# Patient Record
Sex: Female | Born: 1940 | Race: White | Hispanic: No | Marital: Married | Smoking: Former smoker
Health system: Southern US, Community
[De-identification: ages and names within clinical notes are randomized; demographics above are authoritative.]

## PROBLEM LIST (undated history)

## (undated) DIAGNOSIS — Z8589 Personal history of malignant neoplasm of other organs and systems: Secondary | ICD-10-CM

## (undated) DIAGNOSIS — I10 Essential (primary) hypertension: Secondary | ICD-10-CM

## (undated) DIAGNOSIS — E669 Obesity, unspecified: Secondary | ICD-10-CM

## (undated) DIAGNOSIS — K589 Irritable bowel syndrome without diarrhea: Secondary | ICD-10-CM

## (undated) DIAGNOSIS — M810 Age-related osteoporosis without current pathological fracture: Secondary | ICD-10-CM

## (undated) DIAGNOSIS — E785 Hyperlipidemia, unspecified: Secondary | ICD-10-CM

## (undated) HISTORY — DX: Obesity, unspecified: E66.9

## (undated) HISTORY — PX: CHOLECYSTECTOMY: SHX55

## (undated) HISTORY — DX: Irritable bowel syndrome, unspecified: K58.9

## (undated) HISTORY — PX: TONSILLECTOMY: SUR1361

## (undated) HISTORY — DX: Age-related osteoporosis without current pathological fracture: M81.0

## (undated) HISTORY — PX: APPENDECTOMY: SHX54

## (undated) HISTORY — PX: THYROIDECTOMY, PARTIAL: SHX18

## (undated) HISTORY — PX: PARTIAL HYSTERECTOMY: SHX80

## (undated) HISTORY — PX: ANKLE FRACTURE SURGERY: SHX122

## (undated) HISTORY — DX: Personal history of malignant neoplasm of other organs and systems: Z85.89

---

## 2000-08-19 ENCOUNTER — Observation Stay (HOSPITAL_COMMUNITY): Admission: RE | Admit: 2000-08-19 | Discharge: 2000-08-20 | Payer: Self-pay | Admitting: Orthopedic Surgery

## 2000-08-19 ENCOUNTER — Encounter: Payer: Self-pay | Admitting: Orthopedic Surgery

## 2003-10-22 ENCOUNTER — Ambulatory Visit (HOSPITAL_COMMUNITY): Admission: RE | Admit: 2003-10-22 | Discharge: 2003-10-22 | Payer: Self-pay | Admitting: Gastroenterology

## 2010-08-17 DIAGNOSIS — M810 Age-related osteoporosis without current pathological fracture: Secondary | ICD-10-CM

## 2010-08-17 HISTORY — DX: Age-related osteoporosis without current pathological fracture: M81.0

## 2012-10-07 ENCOUNTER — Encounter (HOSPITAL_COMMUNITY): Payer: Self-pay | Admitting: Emergency Medicine

## 2012-10-07 ENCOUNTER — Emergency Department (HOSPITAL_COMMUNITY)
Admission: EM | Admit: 2012-10-07 | Discharge: 2012-10-07 | Disposition: A | Discharge status: Home / Self Care | Payer: Medicare Other | Attending: Emergency Medicine | Admitting: Emergency Medicine

## 2012-10-07 ENCOUNTER — Emergency Department (HOSPITAL_COMMUNITY): Payer: Medicare Other

## 2012-10-07 DIAGNOSIS — E785 Hyperlipidemia, unspecified: Secondary | ICD-10-CM | POA: Insufficient documentation

## 2012-10-07 DIAGNOSIS — Z79899 Other long term (current) drug therapy: Secondary | ICD-10-CM | POA: Insufficient documentation

## 2012-10-07 DIAGNOSIS — I10 Essential (primary) hypertension: Secondary | ICD-10-CM | POA: Insufficient documentation

## 2012-10-07 DIAGNOSIS — S0003XA Contusion of scalp, initial encounter: Secondary | ICD-10-CM | POA: Insufficient documentation

## 2012-10-07 DIAGNOSIS — S060XAA Concussion with loss of consciousness status unknown, initial encounter: Secondary | ICD-10-CM | POA: Insufficient documentation

## 2012-10-07 DIAGNOSIS — Y939 Activity, unspecified: Secondary | ICD-10-CM | POA: Insufficient documentation

## 2012-10-07 DIAGNOSIS — Y9289 Other specified places as the place of occurrence of the external cause: Secondary | ICD-10-CM | POA: Insufficient documentation

## 2012-10-07 DIAGNOSIS — Z9181 History of falling: Secondary | ICD-10-CM | POA: Insufficient documentation

## 2012-10-07 DIAGNOSIS — W19XXXA Unspecified fall, initial encounter: Secondary | ICD-10-CM | POA: Insufficient documentation

## 2012-10-07 HISTORY — DX: Hyperlipidemia, unspecified: E78.5

## 2012-10-07 HISTORY — DX: Essential (primary) hypertension: I10

## 2012-10-07 LAB — POCT I-STAT, CHEM 8
Chloride: 107 mEq/L (ref 96–112)
Glucose, Bld: 124 mg/dL — ABNORMAL HIGH (ref 70–99)
HCT: 42 % (ref 36.0–46.0)
Potassium: 3.8 mEq/L (ref 3.5–5.1)
Sodium: 143 mEq/L (ref 135–145)

## 2012-10-07 LAB — CBC WITH DIFFERENTIAL/PLATELET
Basophils Relative: 0 % (ref 0–1)
HCT: 41.8 % (ref 36.0–46.0)
Hemoglobin: 14.2 g/dL (ref 12.0–15.0)
Lymphs Abs: 1.4 10*3/uL (ref 0.7–4.0)
MCH: 28.7 pg (ref 26.0–34.0)
MCHC: 34 g/dL (ref 30.0–36.0)
Monocytes Absolute: 0.9 10*3/uL (ref 0.1–1.0)
Monocytes Relative: 8 % (ref 3–12)
Neutro Abs: 9 10*3/uL — ABNORMAL HIGH (ref 1.7–7.7)

## 2012-10-07 LAB — URINALYSIS, ROUTINE W REFLEX MICROSCOPIC
Bilirubin Urine: NEGATIVE
Glucose, UA: NEGATIVE mg/dL
Hgb urine dipstick: NEGATIVE
Specific Gravity, Urine: 1.008 (ref 1.005–1.030)
Urobilinogen, UA: 0.2 mg/dL (ref 0.0–1.0)

## 2012-10-07 LAB — PROTIME-INR: INR: 0.88 (ref 0.00–1.49)

## 2012-10-07 LAB — APTT: aPTT: 32 seconds (ref 24–37)
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
## 2012-10-07 NOTE — ED Notes (Signed)
Pt returned from CT and placed back on monitor; pt alert and mentating appropriately; Pt knows where she is and why she is here; pt is unsure of how she fell; husband states this is pts third fall in a little over a year; pt denies numbness/tingling; pt denies blurred vision; pt denies dizziness and lightheadedness.
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
## 2012-10-07 NOTE — ED Notes (Signed)
Pt given d/c teaching and follow up care instructions; pts husband has been educated on signs and symptoms to look out for and need to return to ED; pt has been educated on at home pain management; pt and husband verbalize understanding of d/c instructions and have no further questions upon d/c. Pt taken to waiting room in wheelchair to wait for husband.

## 2014-08-17 LAB — BASIC METABOLIC PANEL: BUN: 14 mg/dL (ref 4–21)

## 2015-02-28 LAB — CBC AND DIFFERENTIAL
HEMATOCRIT: 44 % (ref 36–46)
Hemoglobin: 14.5 g/dL (ref 12.0–16.0)
Platelets: 153 10*3/uL (ref 150–399)
WBC: 5.8 10^3/mL

## 2015-02-28 LAB — LIPID PANEL
Cholesterol: 150 mg/dL (ref 0–200)
HDL: 44 mg/dL (ref 35–70)
LDL Cholesterol: 80 mg/dL
LDL/HDL RATIO: 3.4
TRIGLYCERIDES: 129 mg/dL (ref 40–160)

## 2015-02-28 LAB — BASIC METABOLIC PANEL
GLUCOSE: 106 mg/dL
POTASSIUM: 4.7 mmol/L (ref 3.4–5.3)
Sodium: 138 mmol/L (ref 137–147)

## 2015-05-08 ENCOUNTER — Encounter: Payer: Self-pay | Admitting: Internal Medicine

## 2015-05-09 ENCOUNTER — Ambulatory Visit (INDEPENDENT_AMBULATORY_CARE_PROVIDER_SITE_OTHER): Payer: Medicare Other | Admitting: Internal Medicine

## 2015-05-09 ENCOUNTER — Encounter: Payer: Self-pay | Admitting: Internal Medicine

## 2015-05-09 DIAGNOSIS — I10 Essential (primary) hypertension: Secondary | ICD-10-CM | POA: Diagnosis not present

## 2015-05-09 DIAGNOSIS — E559 Vitamin D deficiency, unspecified: Secondary | ICD-10-CM

## 2015-05-09 DIAGNOSIS — E213 Hyperparathyroidism, unspecified: Secondary | ICD-10-CM | POA: Diagnosis not present

## 2015-05-09 MED ORDER — LOSARTAN POTASSIUM 100 MG PO TABS: 100.0000 mg | ORAL_TABLET | Freq: Every day | ORAL | Status: DC

## 2015-05-09 MED ORDER — FUROSEMIDE 20 MG PO TABS: 20.0000 mg | ORAL_TABLET | Freq: Every day | ORAL | Status: DC
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
## 2015-05-09 NOTE — Patient Instructions (Signed)
Please increase Vitamin D to 2000 units daily.  Maintain a daily calcium intake of ~1000 mg - from diet.  Stop Hyzaar and start: - Losartan 100 mg daily - Lasix 20 mg daily  Please call me if Blood Pressure >130/90.  Please schedule a lab appointment in 1 month.   Please come back for a follow-up appointment in 6 months.  Hypercalcemia Hypercalcemia means the calcium in your blood is too high. Calcium in our blood is important for the control of many things, such as:  Blood clotting.  Conducting of nerve impulses.  Muscle contraction.  Maintaining teeth and bone health.  Other body functions. In the bloodstream, calcium maintains a constant balance with another mineral, phosphate. Calcium is absorbed into the body through the small intestine. This is helped by vitamin D. Calcium levels are maintained mostly by vitamin D and a hormone (parathyroid hormone). But the kidneys also help. Hypercalcemia can happen when the concentration of calcium is too high for the kidneys to maintain balance. The body maintains a balance between the calcium we eat and the calcium already in our body. If calcium intake is increased or we cannot use calcium properly, there may be problems. Some common sources of calcium are:   Dairy products.  Nuts.  Eggs.  Whole grains.  Legumes.  Green leafy vegetables. CAUSES There are many causes of this condition, but some common ones are:  Hyperparathyroidism. This is an overactivity of the parathyroid gland.  Cancers of the breast, kidney, lung, head, and neck are common causes of calcium increases.  Medications that cause you to urinate more often (diuretics), nausea, vomiting, and diarrhea also increase the calcium in the blood.  Overuse of calcium-containing antacids. SYMPTOMS  Many patients with mild hypercalcemia have no symptoms. For those with symptoms, common problems include:  Loss of appetite.  Constipation.  Increased  thirst.  Heart rhythm changes.  Abnormal thinking.  Nausea.  Abdominal pain.  Kidney stones.  Mood swings.  Coma and death when severe.  Vomiting.  Increased urination.  High blood pressure.  Confusion. DIAGNOSIS   Your caregiver will do a medical history and perform a physical exam on you.  Calcium and parathyroid hormone (PTH) may be measured with a blood test. TREATMENT   The treatment depends on the calcium level and what is causing the higher level. Hypercalcemia can be life threatening. Fast lowering of the calcium level may be necessary.  With normal kidney function, fluids can be given by vein to clear the excess calcium. Hemodialysis works well to reduce dangerous calcium levels if there is poor kidney function. This is a procedure in which a machine is used to filter out unwanted substances. The blood is then returned to the body.  Drugs, such as diuretics, can be given after adequate fluid intake is established. These medications help the kidneys get rid of extra calcium. Drugs that lessen (inhibit) bone loss are helpful in gaining long-term control. Phosphate pills help lower high calcium levels caused by a low supply of phosphate. Anti-inflammatory agents such as steroids are helpful with some cancers and toxic levels of vitamin D.  Treatment of the underlying cause of the hypercalcemia will also correct the imbalance. Hyperparathyroidism is usually treated by surgical removal of one or more of the parathyroid glands and any tissue, other than the glands themselves, that is producing too much hormone.  The hypercalcemia caused by cancer is difficult to treat without controlling the cancer. Symptoms can be improved with fluids and  drug therapy as outlined above. PROGNOSIS   Surgery to remove the parathyroid glands is usually successful. This also depends on the amount of damage to the kidneys and whether or not it can be treated.  Mild hypercalcemia can be  controlled with good fluid intake and the use of effective medications.  Hypercalcemia often develops as a late complication of cancer. The expected outlook is poor without effective anticancer therapy. PREVENTION   If you are at risk for developing hypercalcemia, be familiar with early symptoms. Report these to your caregiver.  Good fluid intake (up to four quarts of liquid a day if possible) is helpful.  Try to control nausea and vomiting, and treat fevers to avoid dehydration.  Lowering the amount of calcium in your diet is not necessary. High blood calcium reduces absorption of calcium in the intestine.  Stay as active as possible. SEEK IMMEDIATE MEDICAL CARE IF:   You develop chest pain, sweating, or shortness of breath.  You get confused, feel faint or pass out.  You develop severe nausea and vomiting. MAKE SURE YOU:   Understand these instructions.  Will watch your condition.  Will get help right away if you are not doing well or get worse. Document Released: 10/17/2004 Document Revised: 12/18/2013 Document Reviewed: 07/29/2010 [facility] Patient Information 2015 [place], [place]. This information is not intended to replace advice given to you by your health care Josie Burleigh. Make sure you discuss any questions you have with your health care Augusta Hilbert.

## 2015-06-07 ENCOUNTER — Other Ambulatory Visit (INDEPENDENT_AMBULATORY_CARE_PROVIDER_SITE_OTHER): Payer: Medicare Other

## 2015-06-07 ENCOUNTER — Other Ambulatory Visit: Payer: Self-pay | Admitting: *Deleted

## 2015-06-07 ENCOUNTER — Other Ambulatory Visit: Payer: Self-pay | Admitting: Internal Medicine

## 2015-06-07 DIAGNOSIS — E213 Hyperparathyroidism, unspecified: Secondary | ICD-10-CM

## 2015-06-07 DIAGNOSIS — E559 Vitamin D deficiency, unspecified: Secondary | ICD-10-CM

## 2015-06-07 LAB — VITAMIN D 25 HYDROXY (VIT D DEFICIENCY, FRACTURES): VITD: 46.13 ng/mL (ref 30.00–100.00)

## 2015-06-17 ENCOUNTER — Other Ambulatory Visit: Payer: Medicare Other

## 2015-06-18 LAB — CALCIUM, URINE, 24 HOUR
Calcium, 24 hour urine: 85 mg/24 h (ref 35–250)
Calcium, Ur: 5 mg/dL

## 2015-06-18 LAB — CREATININE, URINE, 24 HOUR
CREATININE 24H UR: 0.51 g/(24.h) — AB (ref 0.63–2.50)
CREATININE, URINE: 30 mg/dL (ref 20–320)

## 2015-06-19 ENCOUNTER — Other Ambulatory Visit: Payer: Medicare Other

## 2015-06-20 ENCOUNTER — Other Ambulatory Visit (INDEPENDENT_AMBULATORY_CARE_PROVIDER_SITE_OTHER): Payer: Medicare Other

## 2015-06-20 DIAGNOSIS — E213 Hyperparathyroidism, unspecified: Secondary | ICD-10-CM | POA: Diagnosis not present

## 2015-06-20 LAB — PHOSPHORUS: Phosphorus: 2.4 mg/dL (ref 2.3–4.6)

## 2015-06-20 LAB — MAGNESIUM: Magnesium: 1.8 mg/dL (ref 1.5–2.5)

## 2015-06-21 LAB — PTH, INTACT AND CALCIUM
CALCIUM: 10.2 mg/dL (ref 8.7–10.3)
PTH: 82 pg/mL — AB (ref 15–65)

## 2015-06-23 LAB — VITAMIN D 1,25 DIHYDROXY
VITAMIN D 1, 25 (OH) TOTAL: 52 pg/mL (ref 18–72)
VITAMIN D2 1, 25 (OH): 16 pg/mL
Vitamin D3 1, 25 (OH)2: 36 pg/mL

## 2015-06-25 ENCOUNTER — Encounter: Payer: Self-pay | Admitting: Internal Medicine

## 2015-07-01 ENCOUNTER — Telehealth: Payer: Self-pay | Admitting: *Deleted

## 2015-07-01 ENCOUNTER — Ambulatory Visit (INDEPENDENT_AMBULATORY_CARE_PROVIDER_SITE_OTHER): Payer: Medicare Other | Admitting: Internal Medicine

## 2015-07-01 ENCOUNTER — Encounter: Payer: Self-pay | Admitting: Internal Medicine

## 2015-07-01 VITALS — BP 118/72 | HR 92 | Temp 98.5°F | Resp 12 | Wt 186.8 lb

## 2015-07-01 DIAGNOSIS — E559 Vitamin D deficiency, unspecified: Secondary | ICD-10-CM | POA: Diagnosis not present

## 2015-07-01 DIAGNOSIS — M810 Age-related osteoporosis without current pathological fracture: Secondary | ICD-10-CM | POA: Insufficient documentation

## 2015-07-01 DIAGNOSIS — E213 Hyperparathyroidism, unspecified: Secondary | ICD-10-CM

## 2015-07-01 LAB — BASIC METABOLIC PANEL
BUN: 20 mg/dL (ref 6–23)
CALCIUM: 10.7 mg/dL — AB (ref 8.4–10.5)
CHLORIDE: 104 meq/L (ref 96–112)
CO2: 30 mEq/L (ref 19–32)
CREATININE: 0.93 mg/dL (ref 0.40–1.20)
GFR: 62.27 mL/min (ref >60.00)
Glucose, Bld: 111 mg/dL — ABNORMAL HIGH (ref 70–99)
Potassium: 4 mEq/L (ref 3.5–5.1)
Sodium: 141 mEq/L (ref 135–145)

## 2015-07-01 NOTE — Progress Notes (Signed)
Patient ID: Katherine Bennett, female   DOB: 08/29/1940, 74 y.o.   MRN: [id number]   HPI  Katherine Bennett is a 74 y.o.-year-old female, initially referred by her PCP, Dr. Juluis Rainier, now returning for f/u for hypercalcemia/hyperparathyroidism and osteoporosis. She saw endocrinology in the past, Dr. Izell Henry ([address], [facility]). Last visit with me 2 mo ago.  Since last visit >> hyperextended L knee >> now in knee brace.  Reviewed and addended hx: Pt was dx with hypercalcemia 2014. I reviewed pt's pertinent labs: Lab Results  Component Value Date   PTH 82* 06/20/2015   PTH Comment 06/20/2015   CALCIUM 10.2 06/20/2015  01/20/2013: Calcium 10.6 (8.6-10.3) 09/12/2013: Calcium 10.7 02/28/2015: Calcium 10.9 03/05/2015: Calcium 10.5, intact PTH 76  Patient has a history of vitamin D deficiency. Vitamin D levels reviewed per records from PCP: 06/07/2015: vitamin D 46.13 09/11/2013: vitamin D 33.4 02/28/2015: vitamin D 24.9 She was on Ergocalciferol 50,000 IU weekly x 8 weeks, now on 2000 IU daily.  Per review of notes from PCP, she has had a history of osteopenia, with the lowest T score of -2.4 in 2009, however, she was found to have osteoporosis per DEXA scan in 2012:  DEXA: 09/20/2013 ([facility] @ [place]): L1-L4 -2.3; RFN -2.4; ultra distal radius -3.3, 33% distal radius -3.2; FRAX score: MOF:  15.3%, hip fracture risk: 4.5% 01/15/2011 (Lunar @ [place]):L1-L4 -2.2; RFN -2.6; ultra distal radius -3.4, 33% distal radius -3.5 12/15/2007 (Lunar @ [place]): L1-L4 -2.4; RFN -2.4  She has a history of bimalleolar fracture of the right ankle, s/p ORIF in 2002. She developed RSD.   No h/o kidney stones.  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 Last BUN/Cr: 02/28/2015: 14/1.01, GFR 53 Lab Results  Component Value Date   BUN 14 08/17/2014   CREATININE 1.00 10/07/2012    Pt does not have a FH of hypercalcemia, pituitary tumors, thyroid cancer, or osteoporosis. H/o kidney stones in  brother.  Pt was on 25 mg of HCTZ daily - started >10 years ago >> we stopped this at last visit and changed to Lasix and recheck pertinent labs:  Component     Latest Ref Rng 06/17/2015  Calcium, Ur     Not estab mg/dL 5  Calcium, 24 hour urine     35 - 250 mg/24 h 85  Creatinine, Urine     20 - 320 mg/dL 30  Creatinine, 40J Ur     0.63 - 2.50 g/24 h 0.51 (L)  24-hour creatinine is low, raising the question of incorrect collection. We may need to repeat the collection in the future.  Labs point towards mild primary HPTH: Component     Latest Ref Rng 06/20/2015 06/20/2015        10:10 AM 10:10 AM  Calcium     8.7 - 10.3 mg/dL  81.1  PTH     15 - 65 pg/mL  82 (H)  Vitamin D 1, 25 (OH) Total     18 - 72 pg/mL 52   Vitamin D3 1, 25 (OH)      36   Vitamin D2 1, 25 (OH)      16   Magnesium     1.5 - 2.5 mg/dL 1.8   Phosphorus     2.3 - 4.6 mg/dL 2.4    I suggested surgery especially in the light of her OP which is selective at the radius level, a possible consequence of HPTH. Patient refuses surgery  at this time, but she is interested in bisphosphonates.   We scheduled this appointment to discuss about different classes of osteoporosis medicines, and possible side effects, and expected benefits.  She also has a history of HTN, HL. L thyroid lobe resected in 1978 for a thyroid nodule >> benign.   ROS: Constitutional: no weight gain, no fatigue, no subjective hyperthermia/hypothermia Eyes: no blurry vision, no xerophthalmia ENT: no sore throat, no nodules palpated in throat, no dysphagia/odynophagia, no hoarseness Cardiovascular: no CP/SOB/palpitations/leg swelling Respiratory: no cough/SOB Gastrointestinal: + N (from pain meds)/no V/D/C Musculoskeletal: no muscle aches/no joint aches  Skin: no rashes Neurological: no tremors/numbness/tingling/dizziness  I reviewed pt's medications, allergies, PMH, social hx, family hx, and changes were documented in the history of  present illness. Otherwise, unchanged from my initial visit note.  Past Medical History  Diagnosis Date  . Hypertension   . Hyperlipidemia   . Osteopenia 2009  . Osteoporosis 2012  . IBS (irritable bowel syndrome)   . Erb's palsy   . Obesity   . History of squamous cell carcinoma     followed by dermatology   Past Surgical History  Procedure Laterality Date  . Tonsillectomy    . Appendectomy    . Cholecystectomy    . Partial hysterectomy    . Thyroidectomy, partial      Left thyroid   . Ankle fracture surgery Right    Social History   Social History  . Marital Status: Married    Spouse Name: N/A  . Number of Children: 1   Occupational History  . retired   Social History Main Topics  . Smoking status: Former Smoker    Quit date: 09/07/1982  . Smokeless tobacco: Not on file  . Alcohol Use: No  . Drug Use: No   Current Outpatient Prescriptions on File Prior to Visit  Medication Sig Dispense Refill  . aspirin (ASPIRIN EC LO-DOSE) 81 MG EC tablet     . cetirizine (ZYRTEC) 10 MG tablet Take 10 mg by mouth daily.    . cholecalciferol (VITAMIN D) 1000 UNITS tablet Take 1,000 Units by mouth daily.    Marland Kitchen dicyclomine (BENTYL) 20 MG tablet Take 20 mg by mouth as needed for spasms.    . fluticasone (FLONASE) 50 MCG/ACT nasal spray Place 2 sprays into both nostrils daily.    Marland Kitchen FLUZONE HIGH-DOSE 0.5 ML SUSY ADM 0.5ML IM UTD  0  . furosemide (LASIX) 20 MG tablet Take 1 tablet (20 mg total) by mouth daily. 90 tablet 3  . Lactobacillus (PROBIOTIC ACIDOPHILUS PO) Take 1 capsule by mouth daily.    Marland Kitchen losartan (COZAAR) 100 MG tablet Take 1 tablet (100 mg total) by mouth daily. 90 tablet 3  . Multiple Vitamin (MULTIVITAMIN WITH MINERALS) TABS Take 1 tablet by mouth daily.    . simvastatin (ZOCOR) 80 MG tablet Take 80 mg by mouth at bedtime.     No current facility-administered medications on file prior to visit.   Allergies  Allergen Reactions  . Morphine And Related    Family  History  Problem Relation Age of Onset  . Heart disease Mother   . Diabetes Mother   . Hypertension Mother    PE: BP 118/72 mmHg  Pulse 92  Temp(Src) 98.5 F (36.9 C) (Oral)  Resp 12  Wt 186 lb 12.8 oz (84.732 kg)  SpO2 95% Wt Readings from Last 3 Encounters:  07/01/15 186 lb 12.8 oz (84.732 kg)  05/09/15 185 lb (83.915 kg)  Constitutional: overweight, in NAD. No kyphosis. Eyes: PERRLA, EOMI, no exophthalmos ENT: moist mucous membranes, no neck masses, cervical scar healed, no cervical lymphadenopathy Cardiovascular: RRR, No MRG Respiratory: CTA B Gastrointestinal: abdomen soft, NT, ND, BS+ Musculoskeletal: no deformities, strength intact in all 4 Skin: moist, warm, no rashes Neurological: no tremor with outstretched hands, DTR normal in all 4  Assessment: 1. Hypercalcemia/hyperparathyroidism  2. Vitamin D deficiency  3. Osteoporosis  Plan: Patient has had several instances of elevated calcium, with the highest level being at 10.9. An intact PTH level was also high, at 76, at that time, calcium being 10.5. - Patient also  has vitamin D deficiency,  with the last level being 24.9 in 02/2015. She was started on high-dose replacement with ergocalciferol 50,000 units once weekly. - Regarding possible complications from hypercalcemia/hyperparathyroidism: No h/o nephrolithiasis, but she does have osteoporosis and had an ancle fracture. No abdominal pain. She has alternating constipation with diarrhea (IBS). No depression, bone pain. - We reviewed together her labs obtained since last visit, and they point towards primary hyperparathyroidism - We again discussed possible consequences of hyperparathyroidism: ~1/3 pts will develop complications over 15 years (OP, nephrolithiasis).  - she meets 2 criteria for parathyroid surgery are:  Increased calcium by more than 1 mg/dL above the upper limit of normal  Kidney ds. (GFR <60) Osteoporosis (or Vb fx) Age <26 years old New  (2013): High UCa >400 mg/d and increased stone risk by biochemical stone risk analysis Presence of nephrolithiasis or nephrocalcinosis Pt's preference!  - For now, she refuses surgery, but would like to think about it. As she has osteoporosis, we discussed about the fact that bisphosphonates would help with both bone mineral density and decreasing her calcium levels.   2. Vitamin D deficiency - Reviewed vitamin D levels: Latest decrease the 24.9.  - She completed an 8 week course of ergocalciferol 50,000 units weekly >> now on 2000 units of over-the-counter vitamin D daily - We reviewed together her latest vitamin D level from less than a month ago, and this was at goal  3. OP - Discussed about increased risk of fracture, depending on the T score, greatly increased when the T score is lower than -2.5, but it is actually a continuum and -2.5 should not be regarded as an absolute threshold. We reviewed her DEXA scans together, and I explained that based on the T scores, she has an increased risk for fractures.  - We discussed about the different medication classes, benefits and side effects (including atypical fractures and ONJ - no dental workup in progress or planned).  - I suggested IV bisphosphonate, zoledronic acid (iv Reclast) or sq denosumab (Prolia). Teriparatide is contraindicated 2/2 her HPTH.  I explained the mechanism of action and expected benefits. She opted for Prolia. - we reviewed her supplemental vitamin D intake, which is adequate: 2000 units vitamin D  >> latest vitamin D obtained last month was normal - We will check a BMP today - We'll start the Prolia PA - will see pt back in one year  - time spent with the patient: 40 min, of which >50% the time was spent in counseling about the above conditions, reviewing previous labs and discussing further treatment for the 3 conditions above.   Office Visit on 07/01/2015  Component Date Value Ref Range Status  . Sodium 07/01/2015 141   135 - 145 mEq/L Final  . Potassium 07/01/2015 4.0  3.5 - 5.1 mEq/L Final  . Chloride 07/01/2015 104  96 - 112 mEq/L Final  . CO2 07/01/2015 30  19 - 32 mEq/L Final  . Glucose, Bld 07/01/2015 111* 70 - 99 mg/dL Final  . BUN 40/98/119111/14/2016 20  6 - 23 mg/dL Final  . Creatinine, Ser 07/01/2015 0.93  0.40 - 1.20 mg/dL Final  . Calcium 47/82/956211/14/2016 10.7* 8.4 - 10.5 mg/dL Final  . GFR 13/08/657811/14/2016 62.27  >60.00 mL/min Final   Dear Katherine Bennett, The Calcium level is again slightly high. Your kidney test is normal. Your blood glucose is a little high, but this was not a fasting sample. Sincerely, Carlus Pavlovristina Jisele Price MD

## 2015-07-01 NOTE — Patient Instructions (Signed)
Please stop at the lab.  We will start the Prolia PA.  Think about the referral to surgery.  Please come back for a follow-up appointment in 6 months.

## 2015-07-01 NOTE — Telephone Encounter (Signed)
I have electronically submitted pt's info for Prolia insurance verification and will notify you once I have a response. Thank you. °

## 2015-07-01 NOTE — Telephone Encounter (Signed)
Rose, will you please initiate a PA for a Prolia inj. Thank you, Carollee HerterShannon.

## 2015-07-02 ENCOUNTER — Other Ambulatory Visit: Payer: Self-pay | Admitting: Family Medicine

## 2015-07-03 ENCOUNTER — Other Ambulatory Visit: Payer: Self-pay | Admitting: Family Medicine

## 2015-07-03 DIAGNOSIS — M25562 Pain in left knee: Secondary | ICD-10-CM

## 2015-07-08 NOTE — Telephone Encounter (Signed)
I have rec'd Katherine Bennett's insurance verification and her insurance advised that Prolia is not covered through her medical benefit at this time.  Katherine Bennett should check w/her Part D to see if it's covered under her pharmacy benefits.  Her pharmacy should be able to help her w/that.  If you have any questions, please let me know. Thank you.

## 2015-07-16 NOTE — Telephone Encounter (Signed)
Patient stated that she never heard anything back from you concerning her prolia shot, she would like to know what she needs to do, please asdvise

## 2015-07-18 NOTE — Telephone Encounter (Signed)
[employer] has sent a corrected summary of benefits.  Ms. Katherine Bennett does have coverage for Prolia.  She will have a $205 co-pay whether or not and OV is billed.  Please advise her this is only and estimate and we will not know an exact amt until her insurance pays. I have sent a copy of the corrected summary of benefits to be scanned into her chart.    If pt cannot afford $205 for her injection, please advise her to contact [employer] at [phone] and select option #1 to see if she qualifies for one of their assistance programs.  If she qualifies they will instruct her how to proceed.  If you have any questions, please let me know. Thank you.

## 2015-07-20 ENCOUNTER — Inpatient Hospital Stay
Admission: RE | Admit: 2015-07-20 | Discharge: 2015-07-20 | Disposition: A | Discharge status: Home / Self Care | Payer: Medicare Other | Source: Ambulatory Visit | Attending: Family Medicine | Admitting: Family Medicine

## 2015-07-20 ENCOUNTER — Ambulatory Visit
Admission: RE | Admit: 2015-07-20 | Discharge: 2015-07-20 | Disposition: A | Discharge status: Home / Self Care | Payer: Medicare Other | Source: Ambulatory Visit | Attending: Family Medicine | Admitting: Family Medicine

## 2015-07-20 DIAGNOSIS — M25562 Pain in left knee: Secondary | ICD-10-CM

## 2015-07-23 NOTE — Telephone Encounter (Signed)
Called pt and advised her per Rose's message. Pt to call back and let me know when she can come have the injection.

## 2015-07-30 ENCOUNTER — Other Ambulatory Visit: Payer: Self-pay | Admitting: *Deleted

## 2015-07-30 ENCOUNTER — Telehealth: Payer: Self-pay | Admitting: Internal Medicine

## 2015-07-30 MED ORDER — LOSARTAN POTASSIUM 100 MG PO TABS: 100.0000 mg | ORAL_TABLET | Freq: Every day | ORAL | Status: DC

## 2015-07-30 NOTE — Telephone Encounter (Signed)
Patient called stating that she would like her B/P medication called in to her pharmacy   Also, she would like to know about her prolia injection    Please advise    Thank you

## 2015-08-06 ENCOUNTER — Other Ambulatory Visit (INDEPENDENT_AMBULATORY_CARE_PROVIDER_SITE_OTHER): Payer: Medicare Other | Admitting: *Deleted

## 2015-08-06 ENCOUNTER — Ambulatory Visit (INDEPENDENT_AMBULATORY_CARE_PROVIDER_SITE_OTHER): Payer: Medicare Other | Admitting: *Deleted

## 2015-08-06 DIAGNOSIS — M810 Age-related osteoporosis without current pathological fracture: Secondary | ICD-10-CM

## 2015-08-06 MED ORDER — DENOSUMAB 60 MG/ML ~~LOC~~ SOLN
60.0000 mg | Freq: Once | SUBCUTANEOUS | Status: AC
  Administered 2015-08-06: 60 mg via SUBCUTANEOUS

## 2015-08-07 ENCOUNTER — Other Ambulatory Visit: Payer: Self-pay | Admitting: *Deleted

## 2015-08-07 MED ORDER — FUROSEMIDE 20 MG PO TABS: 20.0000 mg | ORAL_TABLET | Freq: Every day | ORAL | Status: DC

## 2015-11-04 ENCOUNTER — Other Ambulatory Visit: Payer: Self-pay | Admitting: Internal Medicine

## 2015-11-04 NOTE — Telephone Encounter (Signed)
Further refills per PCP

## 2015-11-06 ENCOUNTER — Ambulatory Visit: Payer: Medicare Other | Admitting: Internal Medicine

## 2015-12-25 ENCOUNTER — Telehealth: Payer: Self-pay | Admitting: *Deleted

## 2015-12-25 NOTE — Telephone Encounter (Signed)
Rose, can you initiate a PA for Prolia inj for this pt, please? Thank you!

## 2015-12-31 ENCOUNTER — Ambulatory Visit: Payer: Medicare Other | Admitting: Internal Medicine

## 2016-01-02 NOTE — Telephone Encounter (Signed)
I have electronically submitted pt's info for Prolia insurance verification and will notify you once I have a response. Thank you. °

## 2016-01-20 NOTE — Telephone Encounter (Signed)
I have rec'd Ms' Wixom's insurance verification for Prolia.  She will have an estimated responsibility of [phone]$215.  Please make pt aware this is an estimate and we will not know an exact amt until insurance(s) has/have paid.  I have sent a copy of the summary of benefits to be scanned into pt's chart.    If pt cannot afford $215 for her injection, please advise her to contact [employer] at [phone] and select option #1 to see if she qualifies for one of their assistance programs.  If she qualifies they will instruct her how to proceed.    Once pt recs injection, please let me know actual injection date so I can update the Prolia portal.  If you have any questions, please let me know.  Thank you!

## 2016-02-03 ENCOUNTER — Other Ambulatory Visit: Payer: Self-pay | Admitting: Internal Medicine

## 2016-02-17 ENCOUNTER — Ambulatory Visit: Payer: Medicare Other | Admitting: Internal Medicine

## 2016-03-02 ENCOUNTER — Telehealth: Payer: Self-pay | Admitting: Endocrinology

## 2016-03-02 ENCOUNTER — Encounter: Payer: Self-pay | Admitting: Internal Medicine

## 2016-03-02 ENCOUNTER — Telehealth: Payer: Self-pay

## 2016-03-02 ENCOUNTER — Ambulatory Visit (INDEPENDENT_AMBULATORY_CARE_PROVIDER_SITE_OTHER): Payer: Medicare Other | Admitting: Internal Medicine

## 2016-03-02 VITALS — BP 128/78 | HR 98 | Ht 63.0 in | Wt 173.0 lb

## 2016-03-02 DIAGNOSIS — E559 Vitamin D deficiency, unspecified: Secondary | ICD-10-CM | POA: Diagnosis not present

## 2016-03-02 DIAGNOSIS — M810 Age-related osteoporosis without current pathological fracture: Secondary | ICD-10-CM | POA: Diagnosis not present

## 2016-03-02 DIAGNOSIS — E213 Hyperparathyroidism, unspecified: Secondary | ICD-10-CM

## 2016-03-02 LAB — VITAMIN D 25 HYDROXY (VIT D DEFICIENCY, FRACTURES): VITD: 53.52 ng/mL (ref 30.00–100.00)

## 2016-03-02 MED ORDER — LOSARTAN POTASSIUM 100 MG PO TABS: 100.0000 mg | ORAL_TABLET | Freq: Every day | ORAL | Status: DC

## 2016-03-02 NOTE — Progress Notes (Signed)
Patient ID: Katherine Bennett, female   DOB: 21-Mar-1941, 75 y.o.   MRN: [id number]   HPI  Katherine Bennett is a 75 y.o.-year-old female, initially referred by her PCP, Dr. Juluis Rainier, now returning for f/u for hypercalcemia/hyperparathyroidism and osteoporosis. She saw endocrinology in the past, Dr. Izell Inman ([address] [facility]). Last visit with me 8 mo ago.  She had L TKR in 11/2015 >> doing well.  Reviewed and addended hx: Pt was dx with hypercalcemia 2014. I reviewed pt's pertinent labs: Lab Results  Component Value Date   PTH 82* 06/20/2015   PTH Comment 06/20/2015   CALCIUM 10.7* 07/01/2015   CALCIUM 10.2 06/20/2015  01/20/2013: Calcium 10.6 (8.6-10.3) 09/12/2013: Calcium 10.7 02/28/2015: Calcium 10.9 03/05/2015: Calcium 10.5, intact PTH 76  Patient has a history of vitamin D deficiency. Vitamin D levels reviewed: Lab Results  Component Value Date   VD25OH 46.13 06/07/2015  - per records from PCP 09/11/2013: vitamin D 33.4 02/28/2015: vitamin D 24.9 She was on Ergocalciferol 50,000 IU weekly x 8 weeks, now on 2000 IU daily.  She has had a history of osteopenia, with the lowest T score of -2.4 in 2009, however, she was found to have osteoporosis per DEXA scan in 2012: 09/20/2013 ([facility] @ [place]): L1-L4 -2.3; RFN -2.4; ultra distal radius -3.3, 33% distal radius -3.2; FRAX score: MOF:  15.3%, hip fracture risk: 4.5% 01/15/2011 ([facility] @ [facility]):L1-L4 -2.2; RFN -2.6; ultra distal radius -3.4, 33% distal radius -3.5 12/15/2007 ([facility] @ [place]): L1-L4 -2.4; RFN -2.4  She has a history of bimalleolar fracture of the right ankle, s/p ORIF in 2002. She developed RSD.   No h/o kidney stones.  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 Last BUN/Cr: Lab Results  Component Value Date   BUN 20 07/01/2015   CREATININE 0.93 07/01/2015    Pt does not have a FH of hypercalcemia, pituitary tumors, thyroid cancer, or osteoporosis. H/o kidney stones in brother.  Pt was on 25  mg of HCTZ daily - started >10 years ago >> we stopped this.  UCa normal: Component     Latest Ref Rng 06/17/2015  Calcium, Ur     Not estab mg/dL 5  Calcium, 24 hour urine     35 - 250 mg/24 h 85  Creatinine, Urine     20 - 320 mg/dL 30  Creatinine, 40J Ur     0.63 - 2.50 g/24 h 0.51 (L)  24-hour creatinine is low, raising the question of incorrect collection. We may need to repeat the collection in the future.  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 Patient refused surgery at this time, but she was interested to start Prolia: had 1st injection 07/2015. She is overdue for the second injection.  She also has a history of HTN, HL. L thyroid lobe resected in 1978 for a thyroid nodule >> benign.   ROS:  Constitutional: + weight loss, no fatigue, no subjective hyperthermia/hypothermia Eyes: no blurry vision, no xerophthalmia ENT: no sore throat, no nodules palpated in throat, no dysphagia/odynophagia, no hoarseness Cardiovascular: no CP/SOB/palpitations/leg swelling Respiratory: no cough/SOB Gastrointestinal: no N/V/+ D/+ C - IBS Musculoskeletal: no muscle aches/no joint aches  Skin: no rashes Neurological: no tremors/numbness/tingling/dizziness  I reviewed pt's medications, allergies, PMH, social hx, family hx, and changes were documented in the history of present illness. Otherwise, unchanged from my initial visit note.  Past Medical History  Diagnosis Date  . Hypertension   . Hyperlipidemia   . Osteopenia 2009  . Osteoporosis 2012  . IBS (irritable bowel syndrome)   . Erb's palsy   .  Obesity   . History of squamous cell carcinoma     followed by dermatology   Past Surgical History  Procedure Laterality Date  . Tonsillectomy    . Appendectomy    . Cholecystectomy    . Partial hysterectomy    . Thyroidectomy, partial      Left thyroid   . Ankle fracture surgery Right    Social History   Social History  . Marital Status: Married    Spouse Name: N/A  . Number of Children: 1   Occupational History  . retired   Social History Main Topics  . Smoking status: Former Smoker    Quit date: 09/07/1982  . Smokeless tobacco: Not on file  . Alcohol Use: No  . Drug Use: No   Current Outpatient Prescriptions on File Prior to Visit  Medication Sig Dispense Refill  . aspirin (ASPIRIN EC LO-DOSE) 81 MG EC tablet     . cetirizine (ZYRTEC) 10 MG tablet Take 10 mg by mouth daily.    . cholecalciferol (VITAMIN D) 1000 UNITS tablet Take 2,000 Units by mouth daily.     Marland Kitchen dicyclomine (BENTYL) 20 MG tablet Take 20 mg by mouth as needed for spasms.    . fluticasone (FLONASE) 50 MCG/ACT nasal spray Place 2 sprays into both nostrils daily.    Marland Kitchen FLUZONE HIGH-DOSE 0.5 ML SUSY ADM 0.5ML IM UTD  0  . furosemide (LASIX) 20 MG tablet Take 1 tablet (20 mg total) by mouth daily. 90 tablet 1  . ibuprofen (ADVIL,MOTRIN) 600 MG tablet take 1 tablet by mouth every 8 hours if needed for pain  0  . Lactobacillus (PROBIOTIC ACIDOPHILUS PO) Take 1 capsule by mouth daily.    Marland Kitchen losartan (COZAAR) 100 MG tablet take 1 tablet by mouth once daily 30 tablet 0  . simvastatin (ZOCOR) 80 MG tablet Take 80 mg by mouth at bedtime.     No current facility-administered medications on file prior to visit.   Allergies  Allergen Reactions  . Morphine And Related    Family History  Problem Relation Age of Onset  . Heart disease Mother   . Diabetes Mother   . Hypertension Mother    PE: BP 128/78 mmHg  Pulse 98  Ht 5\' 3"  (1.6 m)  Wt 173 lb (78.472 kg)  BMI 30.65 kg/m2  SpO2 94% Wt Readings from Last  3 Encounters:  03/02/16 173 lb (78.472 kg)  07/01/15 186 lb 12.8 oz (84.732 kg)  05/09/15 185 lb (83.915 kg)   Constitutional: overweight, in NAD. No kyphosis. Eyes: PERRLA, EOMI, no exophthalmos ENT: moist mucous membranes, no neck masses, no cervical lymphadenopathy Cardiovascular: RRR, No MRG Respiratory: CTA B Gastrointestinal: abdomen soft, NT, ND, BS+ Musculoskeletal: no deformities, strength intact in all 4 Skin: moist, warm, no  rashes Neurological: no tremor with outstretched hands, DTR normal in all 4  Assessment: 1. Hypercalcemia/hyperparathyroidism  2. Vitamin D deficiency  3. Osteoporosis  Plan: Patient has had several instances of elevated calcium, with the highest level being at 10.9. An intact PTH level was also high, at 76 (for a calcium of 10.5). We investigated this further at last visits, and her calcium remains slightly elevated with a slightly elevated PTH. Magnesium, phosphorus, 1, 25 dihydroxy vitamin D, urinary calcium, and 25 hydroxyvitamin D were normal at last check. All her labs point towards primary hyperparathyroidism. - No h/o nephrolithiasis, but she does have osteoporosis and had an ancle fracture. No abdominal pain. She has alternating constipation with diarrhea (IBS). No depression, bone pain.  - We again discussed possible consequences of hyperparathyroidism: ~1/3 pts will develop complications over 15 years (OP, nephrolithiasis).  - she meets 2 criteria for parathyroid surgery: Increased calcium by more than 1 mg/dL above the upper limit of normal  Kidney ds. (GFR <60) Osteoporosis (or Vb fx) Age <77 years old - She refused surgery at last visit, and we readdress this topic at today's visit. She would only accept to have this if her hypercalcemia is dangerous.  - We again discussed about the fact that Prolia would help with both bone mineral density and decreasing her calcium levels. She would much rather continue with the probably a if her calcium  levels are controlled.  2. Vitamin D deficiency - Patient also  has vitamin D deficiency,  with the lowest level being 24.9 in 02/2015. She was started on high-dose replacement with ergocalciferol 50,000 units once weekly, and now continues on 2000 units daily with a normal vit D level at last visit. - Will recheck the level today   3. OP - We reviewed her DEXA reports together >> she will need a new DEXA scan this year, but we discussed to get theis before her next Prolia inj, in 12, 2017, after 1 year of Prolia - will continue Prolia >> explained that she should not delay the prolactin injections, because her bone density decreases abruptly after 6 months post the previous injection - we reviewed her supplemental vitamin D intake, which is adequate: 2000 units vitamin D  >> latest vitamin D obtained last month was normal - We will check a BMP today - We'll starta new  Prolia PA - will see pt back in 6 months  Orders Placed This Encounter  Procedures  . Parathyroid hormone, intact (no Ca)  . VITAMIN D 25 Hydroxy (Vit-D Deficiency, Fractures)  . BASIC METABOLIC PANEL WITH GFR   - time spent with the patient: 40 min, of which >50% the time was spent in counseling about the above conditions, reviewing previous labs and discussing further treatment for the 3 conditions above.   Component     Latest Ref Rng 03/02/2016          Sodium     135 - 146 mmol/L 142  Potassium     3.5 - 5.3 mmol/L 4.8  Chloride     98 - 110 mmol/L 105  CO2     20 - 31 mmol/L 26  Glucose     65 - 99 mg/dL 098118 (H)  BUN     7 - 25 mg/dL 17  Creatinine     1.190.60 - 0.93 mg/dL 1.470.90  Calcium     8.6 - 10.4 mg/dL 82.910.8 (H)  GFR, Est African American     >=60 mL/min  72  GFR, Est Non African American     >=60 mL/min 62  PTH     15 - 65 pg/mL 74 (H)  VITD     30.00 - 100.00 ng/mL 53.52   Vit D level great! Calcium slightly higher and PTH improved, but still high. She is due for Prolia. Will continue to  monitor, but will not refer to Sx for now per pt's preference.  Carlus Pavlov, MD PhD [facility]

## 2016-03-02 NOTE — Patient Instructions (Addendum)
Please stop at the lab.  Please continue vitamin D 2000 units daily.  Please send me a message in 07/2016 to [facility].  Please come back for a follow-up appointment in 6 months.

## 2016-03-02 NOTE — Telephone Encounter (Signed)
Calling patient to notify her of the $215 estimated price for the Prolia injection, and to schedule an appointment to come and get injection. Left message to call back and discuss.

## 2016-03-02 NOTE — Telephone Encounter (Signed)
Patient said it was ok to order the prolia shot

## 2016-03-03 LAB — BASIC METABOLIC PANEL WITH GFR
BUN: 17 mg/dL (ref 7–25)
CALCIUM: 10.8 mg/dL — AB (ref 8.6–10.4)
CHLORIDE: 105 mmol/L (ref 98–110)
CO2: 26 mmol/L (ref 20–31)
Creat: 0.9 mg/dL (ref 0.60–0.93)
GFR, Est African American: 72 mL/min (ref >=60)
GFR, Est Non African American: 62 mL/min (ref >=60)
Glucose, Bld: 118 mg/dL — ABNORMAL HIGH (ref 65–99)
POTASSIUM: 4.8 mmol/L (ref 3.5–5.3)
Sodium: 142 mmol/L (ref 135–146)

## 2016-03-03 LAB — PARATHYROID HORMONE, INTACT (NO CA): PTH: 74 pg/mL — AB (ref 15–65)

## 2016-03-05 ENCOUNTER — Encounter: Payer: Self-pay | Admitting: Internal Medicine

## 2016-03-05 ENCOUNTER — Telehealth: Payer: Self-pay

## 2016-03-05 ENCOUNTER — Telehealth: Payer: Self-pay | Admitting: Internal Medicine

## 2016-03-05 NOTE — Telephone Encounter (Signed)
Pt is inquiring about prolia please

## 2016-03-05 NOTE — Telephone Encounter (Signed)
Called to schedule patient for prolia shot. Advised patient to call back to make appointment for nurse schedule.Will await call back.

## 2016-03-09 ENCOUNTER — Other Ambulatory Visit: Payer: Self-pay | Admitting: Internal Medicine

## 2016-03-09 MED ORDER — ALENDRONATE SODIUM 70 MG PO TABS: 70.0000 mg | ORAL_TABLET | ORAL | 3 refills | Status: DC

## 2016-03-09 NOTE — Telephone Encounter (Signed)
Patient called again and said please call and CMA can leave a message if she would like to.

## 2016-03-10 ENCOUNTER — Ambulatory Visit: Payer: Medicare Other

## 2016-03-30 ENCOUNTER — Telehealth: Payer: Self-pay

## 2016-03-30 ENCOUNTER — Encounter: Payer: Self-pay | Admitting: Internal Medicine

## 2016-03-30 NOTE — Telephone Encounter (Signed)
Called and left message for patient to call back to schedule prolia injection. Patient email was read after call that she did not do the injection as she could not afford it but Dr.Gherghe sent in medication for her to take instead.

## 2016-03-30 NOTE — Telephone Encounter (Signed)
Called patient to notify that Dr.Gherghe does not want to switch her. She needs to stay on fosamax for now. Gave call back number if she had any issues.

## 2016-03-30 NOTE — Telephone Encounter (Signed)
See note

## 2016-03-30 NOTE — Telephone Encounter (Signed)
No, Fosamax does not come in a once a month formulation, we would need to [facility], which is not as good. Also, Sandrea HammondBoniva is not recommended for people with high calcium levels.

## 2016-03-30 NOTE — Telephone Encounter (Signed)
Patient asking if we can switch her from taking something every week, to every month?? Please advise, Thank you!

## 2016-05-08 ENCOUNTER — Ambulatory Visit: Payer: Medicare Other | Admitting: Internal Medicine

## 2016-07-15 IMAGING — MR MR KNEE*L* W/O CM
4 of 6 series · 21 of 40 positions shown · non-contrast
Comparison: Radiographs 06/17/2015

CLINICAL DATA: One month history of knee pain and swelling.
Hyperextension injury getting out of a car.

EXAM:
MRI OF THE LEFT KNEE WITHOUT CONTRAST
TECHNIQUE: Multiplanar, multisequence MR imaging of the knee was performed. No
intravenous contrast was administered.

[Series 3: pd_tse_fs_tra · axial · 3.5mm · 0.42mm/px · z∈[-48,+23]mm · 3 of 22 slices shown]
[im 5/22]
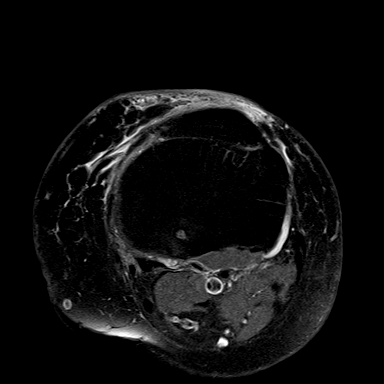
[im 13/22]
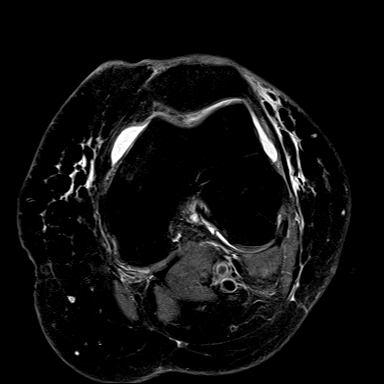
[im 22/22]
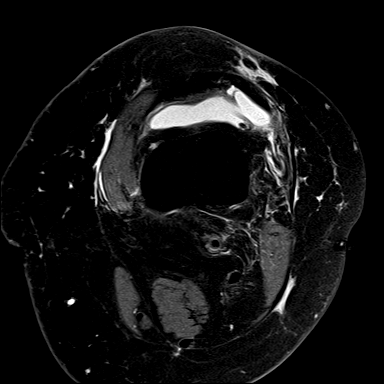

[Series 5: T2 fat-sat · coronal · 3.2mm · 0.62mm/px · 8 of 26 slices shown]
[im 1/26]
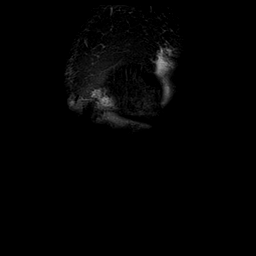
[im 4/26]
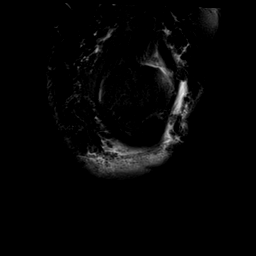
[im 8/26]
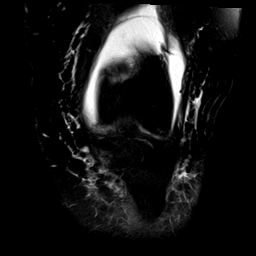
[im 11/26]
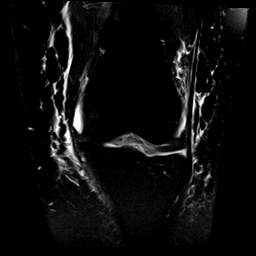
[im 15/26]
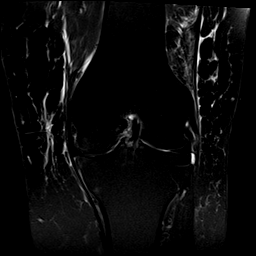
[im 18/26]
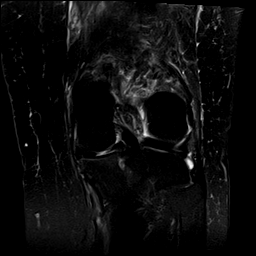
[im 22/26]
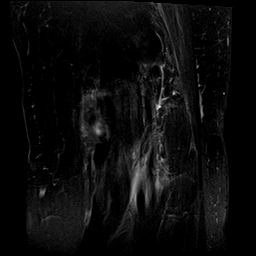
[im 26/26]
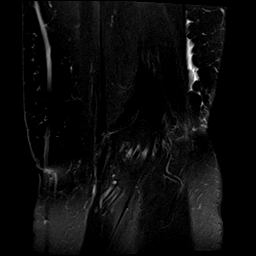

[Series 6: PD fat-sat · sagittal · 3.5mm · 0.25mm/px · 7 of 23 slices shown]
[im 1/23]
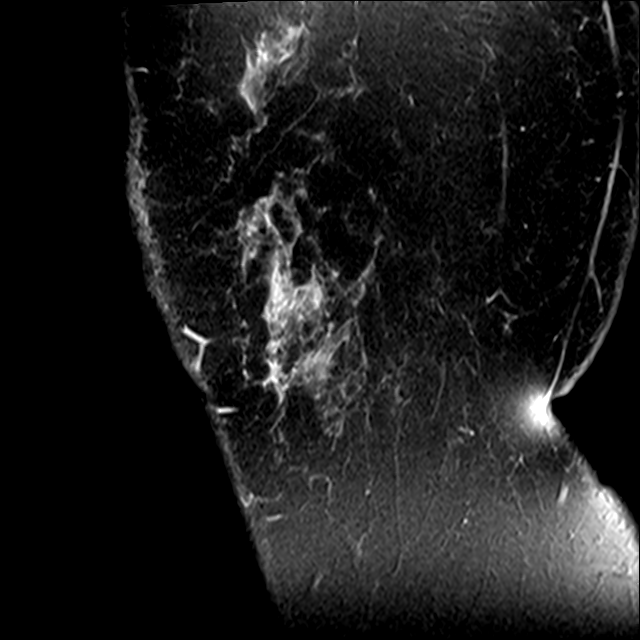
[im 4/23]
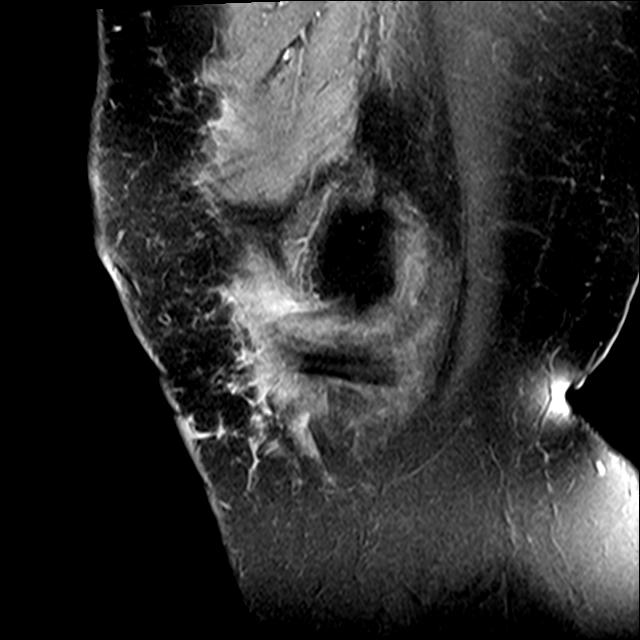
[im 8/23]
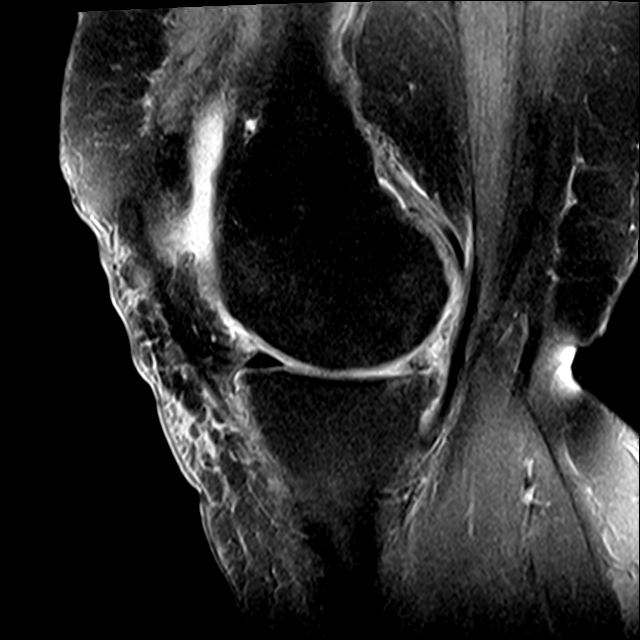
[im 12/23]
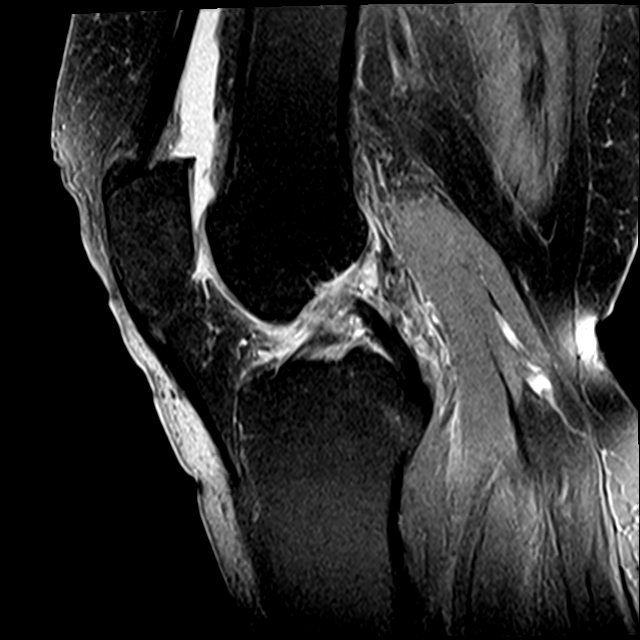
[im 15/23]
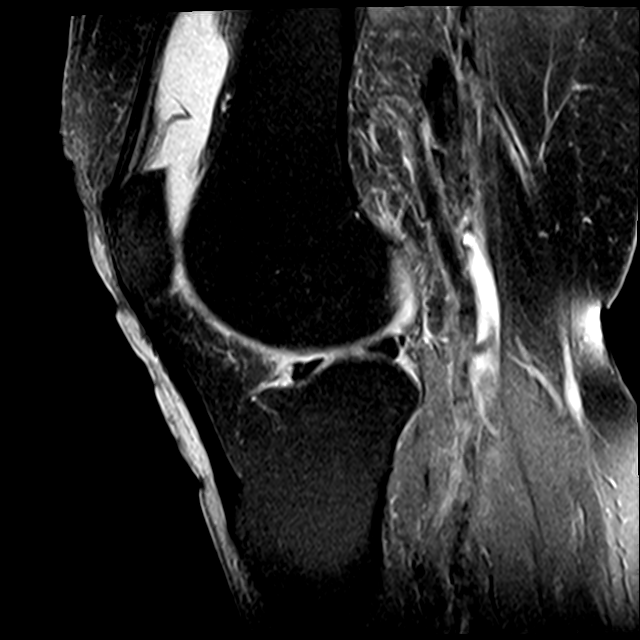
[im 19/23]
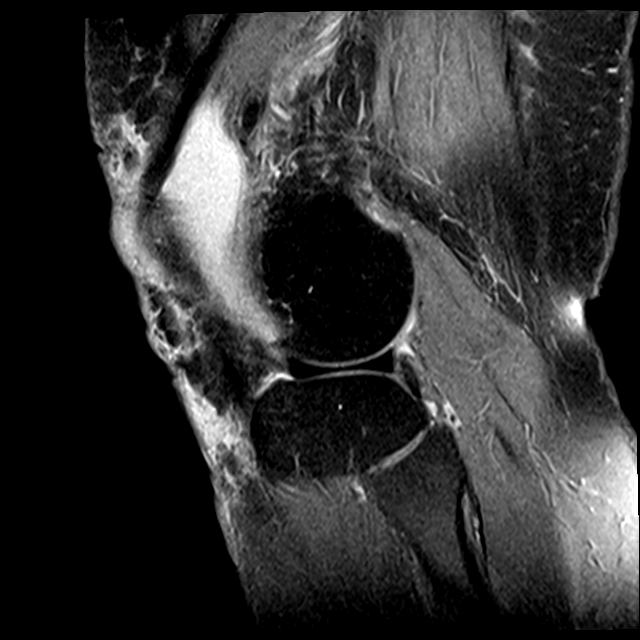
[im 23/23]
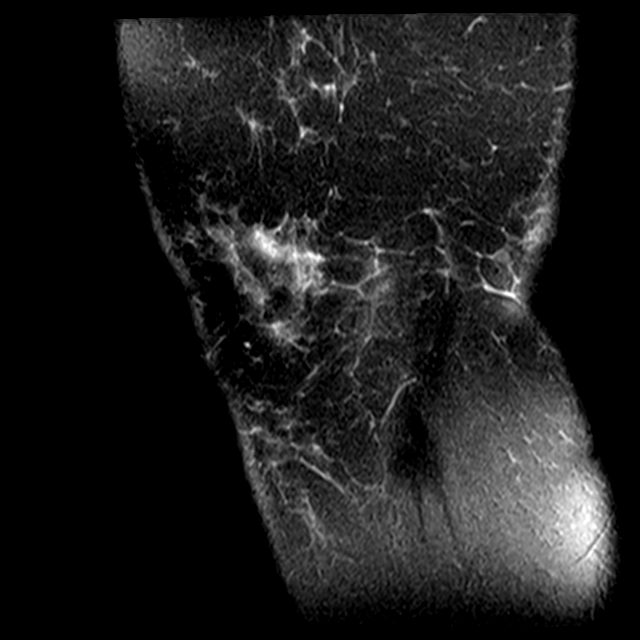

[Series 7: T1 · coronal · 3.2mm · 0.25mm/px · 3 of 26 slices shown]
[im 4/26]
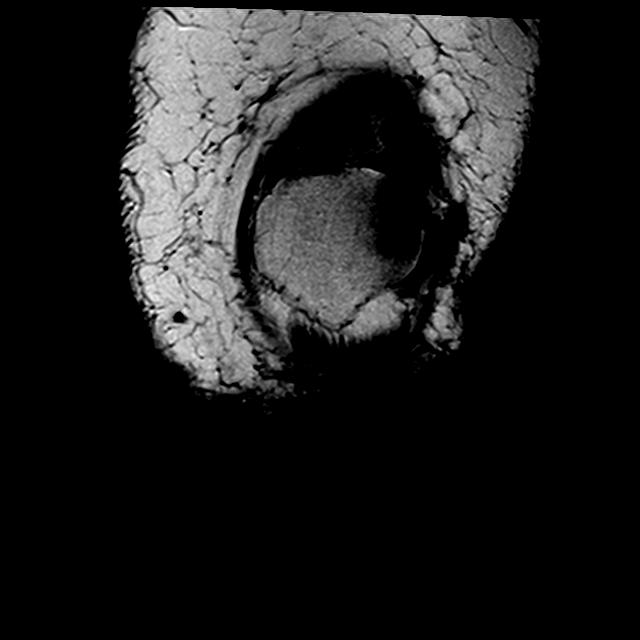
[im 15/26]
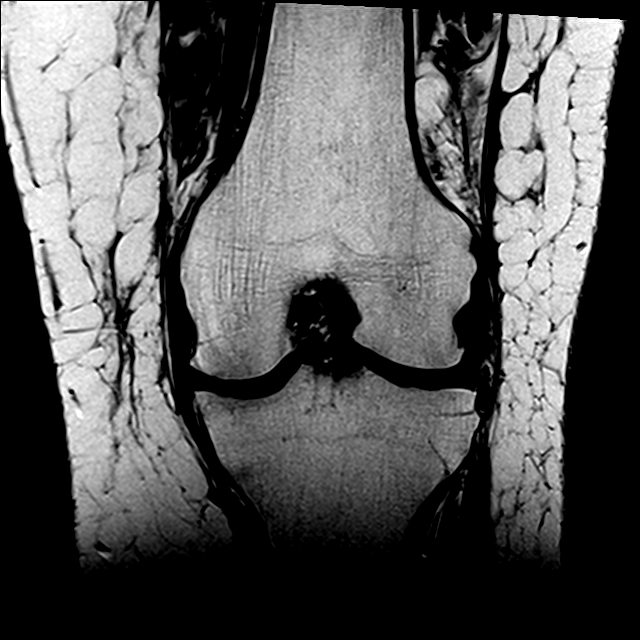
[im 22/26]
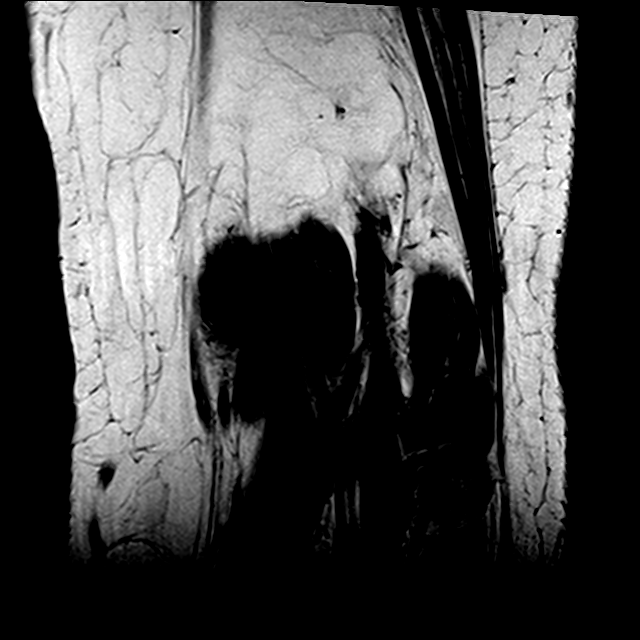

[21 of 40 positions shown; findings below may reference images not displayed]

FINDINGS: MENISCI

Medial meniscus: Complex tear involving the posterior horn with a
radial component and associated mild medial protrusion of the
meniscus. There is also an inferior articular surface tear at the
posterior horn mid body junction region and a small superior
articular surface tear in the mid body.

Lateral meniscus:  Intact.

LIGAMENTS

Cruciates:  Intact.

Collaterals:  Intact.

CARTILAGE

Patellofemoral: Advanced degenerative chondrosis with marked
cartilage thinning but no subchondral cystic change. Joint space
narrowing and osteophytic spurring are noted.

Medial: Advanced degenerative chondrosis with areas of full or near
full-thickness cartilage loss, joint space narrowing, osteophytic
spurring and stress edema.

Lateral: Moderate degenerative chondrosis with mild joint space
narrowing and osteophytic spurring.

Joint:  Moderate-sized joint effusion and mild synovitis.

Popliteal Fossa:  No popliteal mass or Baker's cyst.

Extensor Mechanism: The patella retinacular structures are intact
and the quadriceps and patellar tendons are intact. Mild distal
quadriceps and proximal patellar tendinopathy.

Bones: No acute bony findings. Tricompartmental degenerative
changes.
IMPRESSION: 1. Complex tears involving the posterior horn and mid body region of
the medial meniscus as described above.
2. Intact ligamentous structures and no acute bony findings.
3. Tricompartmental degenerative changes most significant in the
medial compartment.
4. Moderate-sized joint effusion and mild synovitis.
5. Mild distal quadriceps and proximal patellar tendinopathy.

## 2016-09-02 ENCOUNTER — Ambulatory Visit: Payer: Medicare Other | Admitting: Internal Medicine

## 2016-12-14 ENCOUNTER — Observation Stay (HOSPITAL_COMMUNITY)
Admission: EM | Admit: 2016-12-14 | Discharge: 2016-12-16 | Disposition: A | Discharge status: Home / Self Care | Payer: Medicare Other | Attending: Internal Medicine | Admitting: Internal Medicine

## 2016-12-14 ENCOUNTER — Encounter (HOSPITAL_COMMUNITY): Payer: Self-pay | Admitting: Pharmacy Technician

## 2016-12-14 ENCOUNTER — Emergency Department (HOSPITAL_COMMUNITY): Payer: Medicare Other

## 2016-12-14 DIAGNOSIS — Z87891 Personal history of nicotine dependence: Secondary | ICD-10-CM | POA: Insufficient documentation

## 2016-12-14 DIAGNOSIS — E669 Obesity, unspecified: Secondary | ICD-10-CM | POA: Insufficient documentation

## 2016-12-14 DIAGNOSIS — G9340 Encephalopathy, unspecified: Secondary | ICD-10-CM | POA: Diagnosis not present

## 2016-12-14 DIAGNOSIS — Z823 Family history of stroke: Secondary | ICD-10-CM | POA: Insufficient documentation

## 2016-12-14 DIAGNOSIS — Z6830 Body mass index (BMI) 30.0-30.9, adult: Secondary | ICD-10-CM | POA: Diagnosis not present

## 2016-12-14 DIAGNOSIS — M810 Age-related osteoporosis without current pathological fracture: Secondary | ICD-10-CM | POA: Diagnosis not present

## 2016-12-14 DIAGNOSIS — Z90710 Acquired absence of both cervix and uterus: Secondary | ICD-10-CM | POA: Diagnosis not present

## 2016-12-14 DIAGNOSIS — R32 Unspecified urinary incontinence: Secondary | ICD-10-CM | POA: Diagnosis not present

## 2016-12-14 DIAGNOSIS — Z9049 Acquired absence of other specified parts of digestive tract: Secondary | ICD-10-CM | POA: Diagnosis not present

## 2016-12-14 DIAGNOSIS — B9689 Other specified bacterial agents as the cause of diseases classified elsewhere: Secondary | ICD-10-CM | POA: Diagnosis not present

## 2016-12-14 DIAGNOSIS — R945 Abnormal results of liver function studies: Secondary | ICD-10-CM

## 2016-12-14 DIAGNOSIS — K580 Irritable bowel syndrome with diarrhea: Secondary | ICD-10-CM | POA: Insufficient documentation

## 2016-12-14 DIAGNOSIS — I739 Peripheral vascular disease, unspecified: Secondary | ICD-10-CM | POA: Diagnosis not present

## 2016-12-14 DIAGNOSIS — Z885 Allergy status to narcotic agent status: Secondary | ICD-10-CM | POA: Insufficient documentation

## 2016-12-14 DIAGNOSIS — N3090 Cystitis, unspecified without hematuria: Secondary | ICD-10-CM | POA: Diagnosis present

## 2016-12-14 DIAGNOSIS — I10 Essential (primary) hypertension: Secondary | ICD-10-CM | POA: Diagnosis not present

## 2016-12-14 DIAGNOSIS — Z1611 Resistance to penicillins: Secondary | ICD-10-CM | POA: Diagnosis not present

## 2016-12-14 DIAGNOSIS — Z85828 Personal history of other malignant neoplasm of skin: Secondary | ICD-10-CM | POA: Insufficient documentation

## 2016-12-14 DIAGNOSIS — E46 Unspecified protein-calorie malnutrition: Secondary | ICD-10-CM | POA: Diagnosis not present

## 2016-12-14 DIAGNOSIS — B9620 Unspecified Escherichia coli [E. coli] as the cause of diseases classified elsewhere: Secondary | ICD-10-CM | POA: Insufficient documentation

## 2016-12-14 DIAGNOSIS — R7989 Other specified abnormal findings of blood chemistry: Secondary | ICD-10-CM | POA: Diagnosis present

## 2016-12-14 DIAGNOSIS — E785 Hyperlipidemia, unspecified: Secondary | ICD-10-CM | POA: Insufficient documentation

## 2016-12-14 DIAGNOSIS — N390 Urinary tract infection, site not specified: Secondary | ICD-10-CM | POA: Diagnosis not present

## 2016-12-14 DIAGNOSIS — R739 Hyperglycemia, unspecified: Secondary | ICD-10-CM | POA: Diagnosis present

## 2016-12-14 DIAGNOSIS — R109 Unspecified abdominal pain: Secondary | ICD-10-CM

## 2016-12-14 DIAGNOSIS — Z7982 Long term (current) use of aspirin: Secondary | ICD-10-CM | POA: Diagnosis not present

## 2016-12-14 DIAGNOSIS — Z833 Family history of diabetes mellitus: Secondary | ICD-10-CM | POA: Insufficient documentation

## 2016-12-14 DIAGNOSIS — R4781 Slurred speech: Secondary | ICD-10-CM

## 2016-12-14 DIAGNOSIS — Z79899 Other long term (current) drug therapy: Secondary | ICD-10-CM | POA: Insufficient documentation

## 2016-12-14 DIAGNOSIS — Z8249 Family history of ischemic heart disease and other diseases of the circulatory system: Secondary | ICD-10-CM | POA: Insufficient documentation

## 2016-12-14 LAB — COMPREHENSIVE METABOLIC PANEL
ALT: 52 U/L (ref 14–54)
AST: 37 U/L (ref 15–41)
Albumin: 2.9 g/dL — ABNORMAL LOW (ref 3.5–5.0)
Alkaline Phosphatase: 135 U/L — ABNORMAL HIGH (ref 38–126)
Anion gap: 9 (ref 5–15)
BUN: 15 mg/dL (ref 6–20)
CO2: 25 mmol/L (ref 22–32)
Calcium: 9.5 mg/dL (ref 8.9–10.3)
Chloride: 103 mmol/L (ref 101–111)
Creatinine, Ser: 1 mg/dL (ref 0.44–1.00)
GFR, EST NON AFRICAN AMERICAN: 53 mL/min — AB (ref >60)
Glucose, Bld: 133 mg/dL — ABNORMAL HIGH (ref 65–99)
POTASSIUM: 3.6 mmol/L (ref 3.5–5.1)
Sodium: 137 mmol/L (ref 135–145)
TOTAL PROTEIN: 6.3 g/dL — AB (ref 6.5–8.1)
Total Bilirubin: 1.5 mg/dL — ABNORMAL HIGH (ref 0.3–1.2)

## 2016-12-14 LAB — I-STAT TROPONIN, ED: TROPONIN I, POC: 0 ng/mL (ref 0.00–0.08)

## 2016-12-14 LAB — CBC
HEMATOCRIT: 41 % (ref 36.0–46.0)
HEMOGLOBIN: 13.3 g/dL (ref 12.0–15.0)
MCH: 28.2 pg (ref 26.0–34.0)
MCHC: 32.4 g/dL (ref 30.0–36.0)
MCV: 86.9 fL (ref 78.0–100.0)
Platelets: 162 10*3/uL (ref 150–400)
RBC: 4.72 MIL/uL (ref 3.87–5.11)
RDW: 12.5 % (ref 11.5–15.5)
WBC: 8.4 10*3/uL (ref 4.0–10.5)

## 2016-12-14 LAB — URINALYSIS, ROUTINE W REFLEX MICROSCOPIC
Bilirubin Urine: NEGATIVE
Glucose, UA: NEGATIVE mg/dL
KETONES UR: 5 mg/dL — AB
Nitrite: POSITIVE — AB
PH: 5 (ref 5.0–8.0)
Protein, ur: 100 mg/dL — AB
Specific Gravity, Urine: 1.02 (ref 1.005–1.030)

## 2016-12-14 LAB — RAPID URINE DRUG SCREEN, HOSP PERFORMED
Amphetamines: NOT DETECTED
BARBITURATES: NOT DETECTED
Benzodiazepines: NOT DETECTED
COCAINE: NOT DETECTED
Opiates: NOT DETECTED
Tetrahydrocannabinol: NOT DETECTED

## 2016-12-14 LAB — DIFFERENTIAL
BASOS ABS: 0 10*3/uL (ref 0.0–0.1)
Basophils Relative: 0 %
EOS ABS: 0 10*3/uL (ref 0.0–0.7)
Eosinophils Relative: 0 %
LYMPHS ABS: 0.4 10*3/uL — AB (ref 0.7–4.0)
Lymphocytes Relative: 5 %
MONO ABS: 0.5 10*3/uL (ref 0.1–1.0)
MONOS PCT: 6 %
Neutro Abs: 7.5 10*3/uL (ref 1.7–7.7)
Neutrophils Relative %: 89 %

## 2016-12-14 LAB — I-STAT CHEM 8, ED
BUN: 19 mg/dL (ref 6–20)
CALCIUM ION: 1.22 mmol/L (ref 1.15–1.40)
CREATININE: 1 mg/dL (ref 0.44–1.00)
Chloride: 102 mmol/L (ref 101–111)
GLUCOSE: 138 mg/dL — AB (ref 65–99)
HCT: 41 % (ref 36.0–46.0)
Hemoglobin: 13.9 g/dL (ref 12.0–15.0)
Potassium: 3.8 mmol/L (ref 3.5–5.1)
Sodium: 136 mmol/L (ref 135–145)
TCO2: 26 mmol/L (ref 0–100)

## 2016-12-14 LAB — APTT: aPTT: 30 seconds (ref 24–36)

## 2016-12-14 LAB — PROTIME-INR
INR: 1.14
Prothrombin Time: 14.7 seconds (ref 11.4–15.2)

## 2016-12-14 MED ORDER — LACTATED RINGERS IV SOLN
INTRAVENOUS | Status: DC
  Administered 2016-12-15: via INTRAVENOUS

## 2016-12-14 MED ORDER — ONDANSETRON HCL 4 MG PO TABS: 4.0000 mg | ORAL_TABLET | Freq: Four times a day (QID) | ORAL | Status: DC | PRN

## 2016-12-14 MED ORDER — ATORVASTATIN CALCIUM 40 MG PO TABS
40.0000 mg | ORAL_TABLET | Freq: Every day | ORAL | Status: DC
  Administered 2016-12-15: 40 mg via ORAL
  Filled 2016-12-14: qty 1

## 2016-12-14 MED ORDER — FLUTICASONE PROPIONATE 50 MCG/ACT NA SUSP
1.0000 | Freq: Every day | NASAL | Status: DC | PRN
Start: 2016-12-14 — End: 2016-12-16
  Filled 2016-12-14: qty 16

## 2016-12-14 MED ORDER — ONDANSETRON HCL 4 MG/2ML IJ SOLN: 4.0000 mg | Freq: Four times a day (QID) | INTRAMUSCULAR | Status: DC | PRN

## 2016-12-14 MED ORDER — ENOXAPARIN SODIUM 40 MG/0.4ML ~~LOC~~ SOLN
40.0000 mg | SUBCUTANEOUS | Status: DC
  Administered 2016-12-15 (×2): 40 mg via SUBCUTANEOUS
  Filled 2016-12-14 (×3): qty 0.4

## 2016-12-14 MED ORDER — DEXTROSE 5 % IV SOLN
1.0000 g | Freq: Once | INTRAVENOUS | Status: AC
  Administered 2016-12-14: 1 g via INTRAVENOUS
  Filled 2016-12-14: qty 10

## 2016-12-14 MED ORDER — LOSARTAN POTASSIUM 50 MG PO TABS
100.0000 mg | ORAL_TABLET | Freq: Every day | ORAL | Status: DC
  Administered 2016-12-15 – 2016-12-16 (×2): 100 mg via ORAL
  Filled 2016-12-14 (×2): qty 2

## 2016-12-14 MED ORDER — ASPIRIN EC 81 MG PO TBEC
81.0000 mg | DELAYED_RELEASE_TABLET | Freq: Every day | ORAL | Status: DC
  Administered 2016-12-15 (×2): 81 mg via ORAL
  Filled 2016-12-14 (×2): qty 1

## 2016-12-14 MED ORDER — LORATADINE 10 MG PO TABS
10.0000 mg | ORAL_TABLET | Freq: Every day | ORAL | Status: DC
  Administered 2016-12-15 – 2016-12-16 (×2): 10 mg via ORAL
  Filled 2016-12-14 (×2): qty 1

## 2016-12-14 MED ORDER — ACETAMINOPHEN 325 MG PO TABS
650.0000 mg | ORAL_TABLET | Freq: Four times a day (QID) | ORAL | Status: DC | PRN
  Administered 2016-12-15 – 2016-12-16 (×3): 650 mg via ORAL
  Filled 2016-12-14 (×3): qty 2

## 2016-12-14 MED ORDER — ACETAMINOPHEN 650 MG RE SUPP: 650.0000 mg | Freq: Four times a day (QID) | RECTAL | Status: DC | PRN

## 2016-12-14 NOTE — ED Notes (Signed)
Paged [facility]UR[facility] for [employer]

## 2016-12-14 NOTE — H&P (Signed)
History and Physical    Katherine Bennett WTU:[id number] DOB: 05-20-1941 DOA: 12/14/2016  PCP: Daphene Calamity, [place] Consultants:  [place]; Letta Median - endocrinologist Patient coming from: home - lives with husband; NOK: daughters, Coralyn Mark and [place], [phone], [phone]  Chief Complaint: AMS  HPI: Katherine Bennett is a 76 y.o. female with medical history significant of HTN, HLD, and IBS presenting with AMS.  Patient started with shakes and tremors last Thursday; she starting taking medicine thinking she had a virus - pain medicine.  Friday, she got up to void and got dizzy and fell; she hit her head that time.  Since then, she has been lethargic, tremulous, somnolent, and now realizes that UTI has been contributing to many of those symptoms.  Family got scared that she wasn't getting better and wanted her to come to the hospital.   She reports equal weakness of her legs.  Slowing of speech but not slurring.  Vomiting x 2 days, none since this morning.  No dysphagia.  Her twin daughters are present and added the following history: best friend in now in Hospice.  Lots of stress.  Generally texts and calls daily.  Decreased calls/texts Thursday/Friday.  Slurring words.  Hit her head with fall.  Tried to get up, fell again - has a bruise along her left bruise from that fall.  Also with 2 prior remote concussions, 1 requiring hospitalization.  Daughter was concerned about a head injury.  Very uncharacteristic behavior from her mother.  Texts she sent made no sense.  Daughter worried she had a stroke.  When they arrived here from [place], their mother was still out of it.  Urinary and fecal incontinence.  "Completely checked out for 3 days".  EMTs noted a concern about left-sided weakness.     ED Course:  Initial concern for TIA after negative CT.  Consult to neurology - Dr. Cheral Marker reviewed the CT and her other vitals and labs and reportedly had little concern for  TIA/CVA.  Thought to be UTI as the cause for her symptoms but daughters still uncomfortable about the possibility of TIA/CVA and so observation requested.  Given 1 dose of Rocephin.  Review of Systems: As per HPI; otherwise review of systems reviewed and negative.   Ambulatory Status:  Ambulates without difficulty  Past Medical History:  Diagnosis Date  . Erb's palsy   . History of squamous cell carcinoma    followed by dermatology  . Hyperlipidemia   . Hypertension   . IBS (irritable bowel syndrome)   . Obesity   . Osteoporosis 2012    Past Surgical History:  Procedure Laterality Date  . ANKLE FRACTURE SURGERY Right   . APPENDECTOMY    . CHOLECYSTECTOMY    . PARTIAL HYSTERECTOMY    . THYROIDECTOMY, PARTIAL     Left thyroid   . TONSILLECTOMY      Social History   Social History  . Marital status: Married    Spouse name: N/A  . Number of children: N/A  . Years of education: N/A   Occupational History  . 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 Smoking status: Former Smoker    Quit date: 09/07/1982  . Smokeless tobacco: Never Used  . Alcohol use 0.0 oz/week     Comment: occasional wine or mimosa  . Drug use: No  . Sexual activity: Not on file   Other Topics Concern  . Not on file  Social History Narrative  . No narrative on file    Allergies  Allergen Reactions  . Morphine And Related Other (See Comments)    hallucinations    Family History  Problem Relation Age of Onset  . Heart disease Mother   . Diabetes Mother   . Hypertension Mother   . Stroke Mother     carotid stenosis    Prior to Admission medications   Medication Sig Start Date End Date Taking? Authorizing Provider  acetaminophen (TYLENOL) 500 MG tablet Take 1,000 mg by mouth every 6 (six) hours as needed for headache (pain).   Yes Historical Provider, MD  aspirin EC 81 MG tablet Take 81 mg by mouth at bedtime.    Yes Historical Provider, MD  cetirizine  (ZYRTEC) 10 MG tablet Take 10 mg by mouth daily.   Yes Historical Provider, MD  Cholecalciferol (VITAMIN D3) 3000 units TABS Take 3,000 Units by mouth daily.   Yes Historical Provider, MD  fluticasone (FLONASE) 50 MCG/ACT nasal spray Place 1 spray into both nostrils daily as needed (seasonal allergies).    Yes Historical Provider, MD  furosemide (LASIX) 20 MG tablet Take 1 tablet (20 mg total) by mouth daily. 08/07/15  Yes Philemon Kingdom, MD  losartan (COZAAR) 100 MG tablet Take 1 tablet (100 mg total) by mouth daily. 03/02/16  Yes Philemon Kingdom, MD  simvastatin (ZOCOR) 80 MG tablet Take 80 mg by mouth at bedtime.   Yes Historical Provider, MD  alendronate (FOSAMAX) 70 MG tablet Take 1 tablet (70 mg total) by mouth every 7 (seven) days. Take with a full glass of water on an empty stomach. Patient not taking: Reported on 12/14/2016 03/09/16   Philemon Kingdom, MD    Physical Exam: Vitals:   12/14/16 1915 12/14/16 1930 12/14/16 1945 12/14/16 2000  BP: 112/73 131/61 (!) 131/52 125/61  Pulse: 88 89 89 90  Resp: (!) 24 (!) 25 (!) 26 (!) 29  Temp:      TempSrc:      SpO2: 99% 100% 96% 93%  Weight:      Height:         General:  Appears calm and comfortable and is NAD Eyes:  PERRL, EOMI, normal lids, iris ENT:  grossly normal hearing, lips & tongue, mmm Neck:  no LAD, masses or thyromegaly Cardiovascular:  RRR, no m/r/g. No LE edema. No carotid bruits. Respiratory:  CTA bilaterally, no w/r/r. Normal respiratory effort. Abdomen:  soft, ntnd, NABS Skin:  no rash or induration seen on limited exam Musculoskeletal:  grossly normal tone BUE/BLE, good ROM, no bony abnormality Psychiatric:  grossly normal mood and affect, speech fluent and appropriate, AOx3 Neurologic:  CN 2-12 grossly intact, moves all extremities in coordinated fashion, sensation intact  Labs on Admission: I have personally reviewed following labs and imaging studies  CBC:  Recent Labs Lab 12/14/16 1825 12/14/16 1837   WBC 8.4  --   NEUTROABS 7.5  --   HGB 13.3 13.9  HCT 41.0 41.0  MCV 86.9  --   PLT 162  --    Basic Metabolic Panel:  Recent Labs Lab 12/14/16 1825 12/14/16 1837  NA 137 136  K 3.6 3.8  CL 103 102  CO2 25  --   GLUCOSE 133* 138*  BUN 15 19  CREATININE 1.00 1.00  CALCIUM 9.5  --    GFR: Estimated Creatinine Clearance: 46.3 mL/min (by C-G formula based on SCr of 1 mg/dL). Liver Function Tests:  Recent Labs Lab  12/14/16 1825  AST 37  ALT 52  ALKPHOS 135*  BILITOT 1.5*  PROT 6.3*  ALBUMIN 2.9*   No results for input(s): LIPASE, AMYLASE in the last 168 hours. No results for input(s): AMMONIA in the last 168 hours. Coagulation Profile:  Recent Labs Lab 12/14/16 1825  INR 1.14   Cardiac Enzymes: No results for input(s): CKTOTAL, CKMB, CKMBINDEX, TROPONINI in the last 168 hours. BNP (last 3 results) No results for input(s): PROBNP in the last 8760 hours. HbA1C: No results for input(s): HGBA1C in the last 72 hours. CBG: No results for input(s): GLUCAP in the last 168 hours. Lipid Profile: No results for input(s): CHOL, HDL, LDLCALC, TRIG, CHOLHDL, LDLDIRECT in the last 72 hours. Thyroid Function Tests: No results for input(s): TSH, T4TOTAL, FREET4, T3FREE, THYROIDAB in the last 72 hours. Anemia Panel: No results for input(s): VITAMINB12, FOLATE, FERRITIN, TIBC, IRON, RETICCTPCT in the last 72 hours. Urine analysis:    Component Value Date/Time   COLORURINE AMBER (A) 12/14/2016 2021   APPEARANCEUR CLOUDY (A) 12/14/2016 2021   LABSPEC 1.020 12/14/2016 2021   PHURINE 5.0 12/14/2016 2021   GLUCOSEU NEGATIVE 12/14/2016 2021   HGBUR MODERATE (A) 12/14/2016 2021   BILIRUBINUR NEGATIVE 12/14/2016 2021   KETONESUR 5 (A) 12/14/2016 2021   PROTEINUR 100 (A) 12/14/2016 2021   UROBILINOGEN 0.2 10/07/2012 1812   NITRITE POSITIVE (A) 12/14/2016 2021   LEUKOCYTESUR MODERATE (A) 12/14/2016 2021    Creatinine Clearance: Estimated Creatinine Clearance: 46.3 mL/min  (by C-G formula based on SCr of 1 mg/dL).  Sepsis Labs: @LABRCNTIP (procalcitonin:4,lacticidven:4) )No results found for this or any previous visit (from the past 240 hour(s)).   Radiological Exams on Admission: Dg Chest 2 View  Result Date: 12/14/2016 CLINICAL DATA:  Headache, dizziness, nausea, vomiting and diarrhea. Fell. Hit left chest on a night table. EXAM: CHEST  2 VIEW COMPARISON:  None. FINDINGS: Normal sized heart. Minimal left basilar atelectasis. Otherwise, clear lungs. Moderate right shoulder degenerative changes. No fracture or pneumothorax seen. IMPRESSION: Minimal left basilar atelectasis. Moderate right shoulder degenerative changes. Electronically Signed   By: Claudie Revering M.D.   On: 12/14/2016 18:34   Ct Head Wo Contrast  Result Date: 12/14/2016 CLINICAL DATA:  Patient fell hitting head against bedroom drawer. EXAM: CT HEAD WITHOUT CONTRAST TECHNIQUE: Contiguous axial images were obtained from the base of the skull through the vertex without intravenous contrast. COMPARISON:  None. FINDINGS: Brain: No evidence of acute infarction, hemorrhage, hydrocephalus, extra-axial collection or mass lesion/mass effect. Vascular: There is mild-to-moderate atherosclerosis of the carotid siphons. No hyperdense vessels. Skull: No skull fracture or bone destruction. Sinuses/Orbits: No acute finding. Other: None. IMPRESSION: No acute intracranial abnormality. Electronically Signed   By: Ashley Royalty M.D.   On: 12/14/2016 19:22    EKG: Independently reviewed.  NSR with rate 91; no evidence of acute ischemia, NSCSLT  Assessment/Plan Principal Problem:   Encephalopathy acute Active Problems:   Acute lower UTI   Hyperglycemia   Malnutrition (HCC)   Elevated LFTs   Pertinent labs: UA: many bacteria, moderate Hgb, moderate LE, +mucous, positive nitrite, 100 protein, TNTC epis and WBC UDS negative Glucose 138 Alk Phos 135, 99 in 8/17 Albumin 2.94.3 in 8/17 Bilirubin 1.5, 1.5 in  8/17  Encephalopathy, likely related to UTI -Patient/family report several day h/o AMS with some generalized weakness, slurring of words -Initial concern was for TIA/CVA -Negative head CT; this far out from the onset of symptoms, it would be unusual for there not to be associated  findings on CT with a stroke and TIA symptoms should not still be ongoing -Neuro consulted by telephone, reviewed images, and agreed that this is unlikely CVA/TIA -UA is abnormal -UTI in the elderly can certainly present with all of the symptoms experienced by the patient and seems to explain her presentation -Will treat with Rocephin -Will also check TSH -Observe overnight -If ongoing improvement in the AM, patient is likely appropriate for discharge -If symptoms have not improved/resolved, consideration of MRI/neuro consult would be reasonable -I did explain this rationale at length to the patient and the daughters and they appeared to be satisfied with the plan.  Specifically, the patient clearly stated that she is fully capable of making her own decisions and does NOT desire an MRI at this time.  Hyperglycemia -May be stress response -Will follow with fasting AM labs -It is unlikely that he will need acute treatment for this issue  Malnutrition -Her albumin is quite low -Will order nutrition consult  Elevated LFTs -Mildly elevated alk phos which is new -Mildly elevated bilirubin which is unchanged from 8/17 -Suggest outpatient f/u   DVT prophylaxis: Lovenox  Code Status: Full - confirmed with patient/family.  The patient was very clear about this as her desire; her daughters were dismayed but deferred to their mother's decision. Family Communication: Daughters present throughout evaluation and are in agreement with the plan  Disposition Plan:  Home once clinically improved Consults called: Neurology (via telephone)  Admission status: It is my clinical opinion that referral for OBSERVATION is  reasonable and necessary in this patient based on the above information provided. The aforementioned taken together are felt to place the patient at high risk for further clinical deterioration. However it is anticipated that the patient may be medically stable for discharge from the hospital within 24 to 48 hours.     Karmen Bongo MD [facility]  If 7PM-7AM, please contact night-coverage [email or web address] Password [email or web address]  12/14/2016, 11:08 PM

## 2016-12-14 NOTE — ED Notes (Signed)
Patient transported to X-ray 

## 2016-12-14 NOTE — ED Notes (Signed)
Family remains at bedside.

## 2016-12-14 NOTE — ED Triage Notes (Signed)
Pt from home via [facility] with reports of headache, dizziness, NVD and fall. Headache dizziness NVD all started on Wednesday. Fall happened on Thursday. Pt hit head and L chest on night table. Per EMS pt with gait abnormalities and favoring her L side. Pt arrives in soft collar. Pt A&OX4.

## 2016-12-15 ENCOUNTER — Observation Stay (HOSPITAL_COMMUNITY): Payer: Medicare Other

## 2016-12-15 DIAGNOSIS — G9340 Encephalopathy, unspecified: Secondary | ICD-10-CM

## 2016-12-15 LAB — BASIC METABOLIC PANEL
Anion gap: 8 (ref 5–15)
BUN: 14 mg/dL (ref 6–20)
CALCIUM: 9.6 mg/dL (ref 8.9–10.3)
CO2: 24 mmol/L (ref 22–32)
CREATININE: 0.88 mg/dL (ref 0.44–1.00)
Chloride: 103 mmol/L (ref 101–111)
Glucose, Bld: 110 mg/dL — ABNORMAL HIGH (ref 65–99)
Potassium: 3.5 mmol/L (ref 3.5–5.1)
SODIUM: 135 mmol/L (ref 135–145)

## 2016-12-15 LAB — TSH: TSH: 0.54 u[IU]/mL (ref 0.350–4.500)

## 2016-12-15 LAB — CBC
HCT: 37 % (ref 36.0–46.0)
Hemoglobin: 12 g/dL (ref 12.0–15.0)
MCH: 28.2 pg (ref 26.0–34.0)
MCHC: 32.4 g/dL (ref 30.0–36.0)
MCV: 87.1 fL (ref 78.0–100.0)
PLATELETS: 148 10*3/uL — AB (ref 150–400)
RBC: 4.25 MIL/uL (ref 3.87–5.11)
RDW: 13 % (ref 11.5–15.5)
WBC: 6.8 10*3/uL (ref 4.0–10.5)

## 2016-12-15 MED ORDER — DEXTROSE 5 % IV SOLN
1.0000 g | INTRAVENOUS | Status: DC
Start: 2016-12-15 — End: 2016-12-16
  Administered 2016-12-15: 1 g via INTRAVENOUS
  Filled 2016-12-15: qty 10

## 2016-12-15 MED ORDER — LORAZEPAM 2 MG/ML IJ SOLN
0.5000 mg | Freq: Once | INTRAMUSCULAR | Status: AC
  Administered 2016-12-15: 0.5 mg via INTRAVENOUS
  Filled 2016-12-15: qty 1

## 2016-12-15 MED ORDER — GADOBENATE DIMEGLUMINE 529 MG/ML IV SOLN
16.0000 mL | Freq: Once | INTRAVENOUS | Status: AC | PRN
  Administered 2016-12-15: 16 mL via INTRAVENOUS

## 2016-12-15 NOTE — Progress Notes (Signed)
Pt. transferred from ER via stretcher to 15M-15; alert and oriented x4; daughters x2 with pt.; instructed pt./family on hospital policies- valuables and how to call for assist and call bell given to pt.. No C/O's noted.

## 2016-12-15 NOTE — Evaluation (Signed)
Physical Therapy Evaluation and Discharge Patient Details Name: Katherine Bennett MRN: [id number] DOB: 09-23-1940 Today's Date: 12/15/2016   History of Present Illness  76 y.o. female with PMH significant of HTN, HLD, and IBS presenting with AMS starting with shakes and tremors 4/26.  Dizziness, fall and hit her head 4/27.  Since then lethargic, tremulous, somnolent.  She reports equal weakness of her legs.  Slowing of speech but not slurring.  Vomiting x 2 days, no dyspagia.  Head CT negative and dx for UTI.  Clinical Impression  Pt presents to PT with above diagnosis.  She ambulated 350 ft and completed stair training requiring min guard for safety.  Pt and husband were educated on safety awareness, fall prevention, and community resources for balance training.  Pt was encouraged to consult PCP for OPPT if she feels necessary for possible remaining balance deficits upon resolving UTI infection. Pt is safe to D/C home with husband where she was independent PTA.  PT is signing off, but please contact if further assistance is needed.    Follow Up Recommendations No PT follow up;Supervision for mobility/OOB    Equipment Recommendations  None recommended by PT    Recommendations for Other Services       Precautions / Restrictions Precautions Precautions: Fall Restrictions Weight Bearing Restrictions: No      Mobility  Bed Mobility               General bed mobility comments: pt OOB in recliner at start of session  Transfers Overall transfer level: Modified independent Equipment used: None                Ambulation/Gait Ambulation/Gait assistance: Min guard Ambulation Distance (Feet): 350 Feet Assistive device: None Gait Pattern/deviations: Step-through pattern;Decreased stride length;Narrow base of support Gait velocity: decreased Gait velocity interpretation: Below normal speed for age/gender General Gait Details: Min guard for safety.  Pt states she wants to walk  without RW.  Pt walking with slight limp due to pain in L hip.    Stairs Stairs: Yes Stairs assistance: Min guard Stair Management: One rail Right;Alternating pattern;Step to pattern Number of Stairs: 11 General stair comments: Min guard for safety.  Pt initially with alternating pattern until cued for step to (up with the good, down with the bad).  Pt educated on step two pattern for more stability and energy conservation.  Wheelchair Mobility    Modified Rankin (Stroke Patients Only)       Balance Overall balance assessment: Needs assistance Sitting-balance support: No upper extremity supported;Feet supported Sitting balance-Leahy Scale: Good     Standing balance support: No upper extremity supported Standing balance-Leahy Scale: Good               High level balance activites: Direction changes;Head turns High Level Balance Comments: Pt cued for horizontal then vertical head turns.  Slowing and slight LOB with both, more with horizontal than vertical.  Pt able to recover independently.             Pertinent Vitals/Pain Pain Assessment: No/denies pain    Home Living Family/patient expects to be discharged to:: Private residence Living Arrangements: Spouse/significant other Available Help at Discharge: Family;Available 24 hours/day Type of Home: House Home Access: Stairs to enter Entrance Stairs-Rails: None Entrance Stairs-Number of Steps: 2 Home Layout: Two level;Bed/bath upstairs Home Equipment: Walker - 2 wheels;Shower seat;Bedside commode;Cane - single point      Prior Function Level of Independence: Independent  Hand Dominance   Dominant Hand: Left    Extremity/Trunk Assessment        Lower Extremity Assessment Lower Extremity Assessment: Generalized weakness;LLE deficits/detail LLE Deficits / Details: Pt reports 4/10 pain in L hip/low back area from fall    Cervical / Trunk Assessment Cervical / Trunk Assessment: Other  exceptions (forward head)  Communication   Communication: No difficulties  Cognition Arousal/Alertness: Awake/alert Behavior During Therapy: WFL for tasks assessed/performed Overall Cognitive Status: Within Functional Limits for tasks assessed                                        General Comments General comments (skin integrity, edema, etc.): Husband present this session.  Pt states he has been to therapy for vertigo so they are familiar with resources for balance training in the area.  Pt educated on participating in community exercise activites ([place] chi, silver sneakers).  Pt also notified to consult PCP about OPPT [place] D/[place] if she feels the need for balance training.    Exercises     Assessment/Plan    PT Assessment Patent does not need any further PT services  PT Problem List         PT Treatment Interventions      PT Goals (Current goals can be found in the Care Plan section)  Acute Rehab PT Goals Patient Stated Goal: pt did not state a goal    Frequency     Barriers to discharge        Co-evaluation               AM-PAC PT "6 Clicks" Daily Activity  Outcome Measure Difficulty turning over in bed (including adjusting bedclothes, sheets and blankets)?: A Little Difficulty moving from lying on back to sitting on the side of the bed? : A Little Difficulty sitting down on and standing up from a chair with arms (e.g., wheelchair, bedside commode, etc,.)?: None Help needed moving to and from a bed to chair (including a wheelchair)?: A Little Help needed walking in hospital room?: A Little Help needed climbing 3-5 steps with a railing? : A Little 6 Click Score: 19    End of Session Equipment Utilized During Treatment: Gait belt Activity Tolerance: Patient tolerated treatment well Patient left: in chair;with call bell/phone within reach;with family/visitor present Nurse Communication: Mobility status PT Visit Diagnosis: Other abnormalities of  gait and mobility (R26.89);Repeated falls (R29.6);Muscle weakness (generalized) (M62.81);Unsteadiness on feet (R26.81)    Time: 2956-2130 PT Time Calculation (min) (ACUTE ONLY): 17 min   Charges:   PT Evaluation $PT Eval Low Complexity: 1 Procedure     PT G Codes:   PT G-Codes **NOT FOR INPATIENT CLASS** Functional Assessment Tool Used: AM-PAC 6 Clicks Basic Mobility Functional Limitation: Mobility: Walking and moving around Mobility: Walking and Moving Around Current Status (Q6578): At least 20 percent but less than 40 percent impaired, limited or restricted Mobility: Walking and Moving Around Goal Status [phone]): At least 20 percent but less than 40 percent impaired, limited or restricted Mobility: Walking and Moving Around Discharge Status [phone]): At least 20 percent but less than 40 percent impaired, limited or restricted    Willaim Rayas SPT  Elray Mcgregor 12/15/2016, 10:34 AM I have read and agree with note above and was present to supervision evaluation session. [place], [place] [phone] 12/15/2016

## 2016-12-15 NOTE — Care Management Note (Signed)
Case Management Note  Patient Details  Name: Katherine Bennett MRN: [id number] Date of Birth: [birth year]  Subjective/Objective:   Pt in with acute encephalopathy. She is from home with spouse.                  Action/Plan: No f/u per PT. Awaiting OT recommendations. CM following for d/c needs, physician orders.   Expected Discharge Date:                  Expected Discharge Plan:  Home/Self Care  In-House Referral:     Discharge planning Services     Post Acute Care Choice:    Choice offered to:     DME Arranged:    DME Agency:     HH Arranged:    HH Agency:     Status of Service:  In process, will continue to follow  If discussed at Long Length of Stay Meetings, dates discussed:    Additional Comments:  Kermit Balo, RN 12/15/2016, 2:35 PM

## 2016-12-15 NOTE — Care Management Obs Status (Signed)
MEDICARE OBSERVATION STATUS NOTIFICATION   Patient Details  Name: Katherine Bennett MRN: [id number] Date of Birth: 12-02-1940   Medicare Observation Status Notification Given:  Yes    Kermit Balo, RN 12/15/2016, 4:04 PM

## 2016-12-15 NOTE — Plan of Care (Signed)
Problem: Education: Goal: Knowledge of [facility] Education information/materials will improve Outcome: Progressing POC reviewed with pt.   

## 2016-12-15 NOTE — Progress Notes (Addendum)
Nutrition Consult/Brief Note  RD consulted for low albumin.  Albumin is not a clinical characteristic for identifying malnutrition per Academy of Nutrition & Dietetics/ASPEN.  Has a half-life of 21 days and is strongly affected by stress response and inflammatory process, therefore, do not expect to see an improvement in this lab value during acute hospitalization.    Wt Readings from Last 15 Encounters:  12/14/16 169 lb 12.8 oz (77 kg)  03/02/16 173 lb (78.5 kg)  07/01/15 186 lb 12.8 oz (84.7 kg)  05/09/15 185 lb (83.9 kg)   Body mass index is 30.08 kg/m. Patient meets criteria for Obesity Class I based on current BMI.   Current diet order is Regular.  Appetite getting better.  Anxious about her MRI.  Labs and medications reviewed.   Nutrition focused physical exam completed.  No muscle or subcutaneous fat depletion noticed.  No nutrition interventions warranted at this time. If nutrition issues arise, please consult RD.   Maureen Chatters, RD, LDN Pager #: [phone] After-Hours Pager #: [phone]

## 2016-12-15 NOTE — Progress Notes (Signed)
Patient ID: Katherine Bennett, female   DOB: 11-20-1940, 76 y.o.   MRN: [id number]  PROGRESS NOTE    Katherine Bennett  QVZ:[id number] DOB: May 28, 1941 DOA: 12/14/2016 PCP: Daphene Calamity, MD (  Brief Narrative:  76 y.o. female with medical history significant of HTN, HLD, and IBS presenting with AMS. Initial CAT scan of the brain was negative for any acute infarct. The patient was admitted with the encephalopathy from probable urinary tract infection. Patient is more awake this morning. She denies any urinary symptoms recently. Family is concerned about  stroke as she had slurring of speech over the last few days and was very drowsy and lethargic. Assessment & Plan:   Principal Problem:   Encephalopathy acute Active Problems:   Acute lower UTI   Hyperglycemia   Malnutrition (HCC)   Elevated LFTs   Encephalopathy, likely related to UTI: Mental status is improving; continue monitoring mental status. -Patient/family report several day h/o AMS with some generalized weakness, slurring of words. Family is concerned about stroke. We will get MRI of the head with MRA of the head and neck. 2-D echo -Neuro consulted by telephone during admission, reviewed images, and agreed that this is unlikely CVA/TIA. If MRI is positive for stroke, will get formal neurology evaluation. -Continue with Rocephin. Follow cultures -Physical therapy evaluation. -Fall precautions -Probable discharge in a.m. on oral antibiotics if workup is negative for stroke.  Hyperglycemia -May be stress response -Will follow with fasting AM labs  Malnutrition -Her albumin is quite low -Follow nutrition consult   Elevated LFTs -Mildly elevated alk phos which is new -Mildly elevated bilirubin which is unchanged from 8/17 -Suggest outpatient f/u. Repeat LFTs in a.m.   DVT prophylaxis: Lovenox  Code Status: DNR Family Communication: Daughters and husband present throughout evaluation and are in agreement with  the plan  Disposition Plan:  Home once clinically improved probably tomorrow Consultants:   None   Procedures: None  Antimicrobials: Rocephin 12/14/16 >>  Subjective: Patient seen and examined at bedside. She is more awake this morning answering questions. She denies any current fever nausea vomiting.  Objective: Vitals:   12/14/16 2230 12/14/16 2350 12/15/16 0506 12/15/16 1001  BP:  (!) 126/46 (!) 134/45 (!) 134/58  Pulse: 89 79 84 88  Resp: 16 20 20 16   Temp:  99 F (37.2 C) 99.5 F (37.5 C) 98.1 F (36.7 C)  TempSrc:  Oral Oral Oral  SpO2: 96% 97% 96% 95%  Weight:  77 kg (169 lb 12.8 oz)    Height:  5' 3"  (1.6 m)      Intake/Output Summary (Last 24 hours) at 12/15/16 1337 Last data filed at 12/15/16 0732  Gross per 24 hour  Intake              890 ml  Output                0 ml  Net              890 ml   Filed Weights   12/14/16 1724 12/14/16 2350  Weight: 77.1 kg (170 lb) 77 kg (169 lb 12.8 oz)    Examination:  General exam: Appears calm and comfortable  Respiratory system: Clear to auscultation. Respiratory effort normal. Cardiovascular system: S1 & S2 heard, RRR.  Gastrointestinal system: Abdomen is nondistended, soft and nontender. No organomegaly or masses felt. Normal bowel sounds heard. Central nervous system: Alert and oriented. No focal neurological deficits. Moving extremities. Extremities: no cyanosis clubbing edema  Skin: No rashes, lesions or ulcers Psychiatry: Judgement and insight appear normal. Mood & affect appropriate.     Data Reviewed: I have personally reviewed following labs and imaging studies  CBC:  Recent Labs Lab 12/14/16 1825 12/14/16 1837 12/15/16 0534  WBC 8.4  --  6.8  NEUTROABS 7.5  --   --   HGB 13.3 13.9 12.0  HCT 41.0 41.0 37.0  MCV 86.9  --  87.1  PLT 162  --  102*   Basic Metabolic Panel:  Recent Labs Lab 12/14/16 1825 12/14/16 1837 12/15/16 0534  NA 137 136 135  K 3.6 3.8 3.5  CL 103 102 103  CO2 25   --  24  GLUCOSE 133* 138* 110*  BUN 15 19 14   CREATININE 1.00 1.00 0.88  CALCIUM 9.5  --  9.6   GFR: Estimated Creatinine Clearance: 52.6 mL/min (by C-G formula based on SCr of 0.88 mg/dL). Liver Function Tests:  Recent Labs Lab 12/14/16 1825  AST 37  ALT 52  ALKPHOS 135*  BILITOT 1.5*  PROT 6.3*  ALBUMIN 2.9*   No results for input(s): LIPASE, AMYLASE in the last 168 hours. No results for input(s): AMMONIA in the last 168 hours. Coagulation Profile:  Recent Labs Lab 12/14/16 1825  INR 1.14   Cardiac Enzymes: No results for input(s): CKTOTAL, CKMB, CKMBINDEX, TROPONINI in the last 168 hours. BNP (last 3 results) No results for input(s): PROBNP in the last 8760 hours. HbA1C: No results for input(s): HGBA1C in the last 72 hours. CBG: No results for input(s): GLUCAP in the last 168 hours. Lipid Profile: No results for input(s): CHOL, HDL, LDLCALC, TRIG, CHOLHDL, LDLDIRECT in the last 72 hours. Thyroid Function Tests:  Recent Labs  12/15/16 0534  TSH 0.540   Anemia Panel: No results for input(s): VITAMINB12, FOLATE, FERRITIN, TIBC, IRON, RETICCTPCT in the last 72 hours. Sepsis Labs: No results for input(s): PROCALCITON, LATICACIDVEN in the last 168 hours.  No results found for this or any previous visit (from the past 240 hour(s)).       Radiology Studies: Dg Chest 2 View  Result Date: 12/14/2016 CLINICAL DATA:  Headache, dizziness, nausea, vomiting and diarrhea. Fell. Hit left chest on a night table. EXAM: CHEST  2 VIEW COMPARISON:  None. FINDINGS: Normal sized heart. Minimal left basilar atelectasis. Otherwise, clear lungs. Moderate right shoulder degenerative changes. No fracture or pneumothorax seen. IMPRESSION: Minimal left basilar atelectasis. Moderate right shoulder degenerative changes. Electronically Signed   By: Claudie Revering M.D.   On: 12/14/2016 18:34   Ct Head Wo Contrast  Result Date: 12/14/2016 CLINICAL DATA:  Patient fell hitting head  against bedroom drawer. EXAM: CT HEAD WITHOUT CONTRAST TECHNIQUE: Contiguous axial images were obtained from the base of the skull through the vertex without intravenous contrast. COMPARISON:  None. FINDINGS: Brain: No evidence of acute infarction, hemorrhage, hydrocephalus, extra-axial collection or mass lesion/mass effect. Vascular: There is mild-to-moderate atherosclerosis of the carotid siphons. No hyperdense vessels. Skull: No skull fracture or bone destruction. Sinuses/Orbits: No acute finding. Other: None. IMPRESSION: No acute intracranial abnormality. Electronically Signed   By: Ashley Royalty M.D.   On: 12/14/2016 19:22        Scheduled Meds: . aspirin EC  81 mg Oral QHS  . atorvastatin  40 mg Oral q1800  . enoxaparin (LOVENOX) injection  40 mg Subcutaneous Q24H  . loratadine  10 mg Oral Daily  . losartan  100 mg Oral Daily   Continuous Infusions: . cefTRIAXone (ROCEPHIN)  IV    . lactated ringers 75 mL/hr at 12/15/16 0013     LOS: 0 days        Aline August, MD [facility] Pager [phone]  If 7PM-7AM, please contact night-coverage [email or web address] Password [email or web address] 12/15/2016, 1:37 PM

## 2016-12-16 ENCOUNTER — Other Ambulatory Visit (HOSPITAL_COMMUNITY): Payer: Medicare Other

## 2016-12-16 ENCOUNTER — Observation Stay (HOSPITAL_BASED_OUTPATIENT_CLINIC_OR_DEPARTMENT_OTHER): Payer: Medicare Other

## 2016-12-16 DIAGNOSIS — R55 Syncope and collapse: Secondary | ICD-10-CM | POA: Diagnosis not present

## 2016-12-16 DIAGNOSIS — N390 Urinary tract infection, site not specified: Secondary | ICD-10-CM

## 2016-12-16 LAB — ECHOCARDIOGRAM COMPLETE
CHL CUP MV DEC (S): 250
CHL CUP RV SYS PRESS: 25 mmHg
E decel time: 250 msec
FS: 28 % (ref 28–44)
HEIGHTINCHES: 63 in
IVS/LV PW RATIO, ED: 1.11
LA vol index: 22.1 mL/m2
LA vol: 41.4 mL
LADIAMINDEX: 1.92 cm/m2
LASIZE: 36 mm
LAVOLA4C: 41.5 mL
LEFT ATRIUM END SYS DIAM: 36 mm
LV PW d: 9 mm — AB (ref 0.6–1.1)
LV TDI E'LATERAL: 10.6
LV TDI E'MEDIAL: 8.92
LVELAT: 10.6 cm/s
LVOT area: 3.46 cm2
LVOT diameter: 21 mm
Lateral S' vel: 11.1 cm/s
MV pk E vel: 0.9 m/s
Reg peak vel: 237 cm/s
TAPSE: 19.4 mm
TR max vel: 237 cm/s
WEIGHTICAEL: 2716.8 [oz_av]

## 2016-12-16 LAB — CBC WITH DIFFERENTIAL/PLATELET
BASOS PCT: 0 %
Basophils Absolute: 0 10*3/uL (ref 0.0–0.1)
EOS ABS: 0.1 10*3/uL (ref 0.0–0.7)
EOS PCT: 2 %
HCT: 37.5 % (ref 36.0–46.0)
HEMOGLOBIN: 12 g/dL (ref 12.0–15.0)
Lymphocytes Relative: 24 %
Lymphs Abs: 1.3 10*3/uL (ref 0.7–4.0)
MCH: 28 pg (ref 26.0–34.0)
MCHC: 32 g/dL (ref 30.0–36.0)
MCV: 87.4 fL (ref 78.0–100.0)
Monocytes Absolute: 0.7 10*3/uL (ref 0.1–1.0)
Monocytes Relative: 12 %
NEUTROS PCT: 62 %
Neutro Abs: 3.3 10*3/uL (ref 1.7–7.7)
PLATELETS: 179 10*3/uL (ref 150–400)
RBC: 4.29 MIL/uL (ref 3.87–5.11)
RDW: 12.8 % (ref 11.5–15.5)
WBC: 5.4 10*3/uL (ref 4.0–10.5)

## 2016-12-16 LAB — COMPREHENSIVE METABOLIC PANEL
ALBUMIN: 2.3 g/dL — AB (ref 3.5–5.0)
ALK PHOS: 104 U/L (ref 38–126)
ALT: 36 U/L (ref 14–54)
ANION GAP: 8 (ref 5–15)
AST: 23 U/L (ref 15–41)
BUN: 10 mg/dL (ref 6–20)
CALCIUM: 10 mg/dL (ref 8.9–10.3)
CHLORIDE: 106 mmol/L (ref 101–111)
CO2: 27 mmol/L (ref 22–32)
Creatinine, Ser: 0.77 mg/dL (ref 0.44–1.00)
GFR calc Af Amer: >60 mL/min (ref >60)
GFR calc non Af Amer: >60 mL/min (ref >60)
GLUCOSE: 117 mg/dL — AB (ref 65–99)
Potassium: 3.6 mmol/L (ref 3.5–5.1)
SODIUM: 141 mmol/L (ref 135–145)
Total Bilirubin: 0.7 mg/dL (ref 0.3–1.2)
Total Protein: 5.7 g/dL — ABNORMAL LOW (ref 6.5–8.1)

## 2016-12-16 MED ORDER — NAPROXEN 250 MG PO TABS
250.0000 mg | ORAL_TABLET | Freq: Two times a day (BID) | ORAL | Status: DC
  Administered 2016-12-16: 250 mg via ORAL
  Filled 2016-12-16: qty 1

## 2016-12-16 MED ORDER — NAPROXEN 250 MG PO TABS: 250.0000 mg | ORAL_TABLET | Freq: Two times a day (BID) | ORAL | 0 refills | Status: DC | PRN

## 2016-12-16 MED ORDER — CEFPODOXIME PROXETIL 200 MG PO TABS
200.0000 mg | ORAL_TABLET | Freq: Two times a day (BID) | ORAL | Status: DC
  Administered 2016-12-16: 200 mg via ORAL
  Filled 2016-12-16 (×2): qty 1

## 2016-12-16 MED ORDER — CEFPODOXIME PROXETIL 200 MG PO TABS: 200.0000 mg | ORAL_TABLET | Freq: Two times a day (BID) | ORAL | 0 refills | Status: AC

## 2016-12-16 NOTE — Progress Notes (Signed)
2D echo completed by echo tech. Per Dr Chancy Milroy discharge orders, patient could be discharged after echo completed, patient does not have to wait for results. Rn educated patient on her discharge paperwork and patient and her daughters at bedside verbalized understanding of education. Patient notified that two prescriptions were sent to her [facility]. IV removed. Patient taken to front lobby by NT in wheelchair where her daughter will be driving her home.

## 2016-12-16 NOTE — Progress Notes (Signed)
  Echocardiogram 2D Echocardiogram has been performed.  Katherine Bennett 12/16/2016, 5:10 PM

## 2016-12-16 NOTE — Evaluation (Signed)
Occupational Therapy Evaluation Patient Details Name: Katherine Bennett MRN: [id number] DOB: [birth year] Today's Date: 12/16/2016    History of Present Illness 76 y.o. female with PMH significant of HTN, HLD, and IBS presenting with AMS starting with shakes and tremors 4/26.  Dizziness, fall and hit her head 4/27.  Since then lethargic, tremulous, somnolent.  She reports equal weakness of her legs.  Slowing of speech but not slurring.  Vomiting x 2 days, no dyspagia.  Head CT negative and dx for UTI.   Clinical Impression   Patient evaluated by Occupational Therapy with no further acute OT needs identified. All education has been completed and the patient has no further questions. Pt appears to be back to baseline.  See below for any follow-up Occupational Therapy or equipment needs. OT is signing off. Thank you for this referral.      Follow Up Recommendations  No OT follow up;Supervision - Intermittent    Equipment Recommendations  None recommended by OT    Recommendations for Other Services       Precautions / Restrictions Precautions Precautions: Fall Restrictions Weight Bearing Restrictions: No      Mobility Bed Mobility               General bed mobility comments: Pt up in chair   Transfers Overall transfer level: Modified independent                    Balance Overall balance assessment: Needs assistance Sitting-balance support: No upper extremity supported;Feet supported Sitting balance-Leahy Scale: Good     Standing balance support: No upper extremity supported;During functional activity Standing balance-Leahy Scale: Good                             ADL either performed or assessed with clinical judgement   ADL Overall ADL's : Modified independent                                             Vision Baseline Vision/History: No visual deficits Patient Visual Report: No change from baseline Vision Assessment?:  No apparent visual deficits     Perception Perception Perception Tested?: Yes   Praxis Praxis Praxis tested?: Within functional limits    Pertinent Vitals/Pain Pain Assessment: 0-10 Pain Score: 7  Pain Location: low back  Pain Descriptors / Indicators: Aching;Grimacing;Guarding Pain Intervention(s): Monitored during session;Limited activity within patient's tolerance;Repositioned;Heat applied     Hand Dominance Left   Extremity/Trunk Assessment Upper Extremity Assessment Upper Extremity Assessment: Overall WFL for tasks assessed   Lower Extremity Assessment Lower Extremity Assessment: Defer to PT evaluation       Communication Communication Communication: No difficulties   Cognition Arousal/Alertness: Awake/alert Behavior During Therapy: WFL for tasks assessed/performed Overall Cognitive Status: Within Functional Limits for tasks assessed                                 General Comments: Pt able to perform serial 2 and 7 subtraction while ambulating in hallway and negotiating obstacles.  She was also able to perform pathfinding mod I    General Comments  daughters present.  Reiterated PT recommendations as daughters report pt needs follow up PT.  Pt reports she prefers to call her ortho MD, and see  about getting an exercise program, and following up with silver sneakers.      Exercises     Shoulder Instructions      Home Living Family/patient expects to be discharged to:: Private residence Living Arrangements: Spouse/significant other Available Help at Discharge: Family;Available 24 hours/day Type of Home: House Home Access: Stairs to enter [employer] of Steps: 2 Entrance Stairs-Rails: None Home Layout: Two level;Bed/bath upstairs Alternate Level Stairs-Number of Steps: 16 Alternate Level Stairs-Rails: Right Bathroom Shower/Tub: Producer, television/film/video: Handicapped height     Home Equipment: Environmental consultant - 2 wheels;Shower  seat;Bedside commode;Cane - single point          Prior Functioning/Environment Level of Independence: Independent        Comments: Pt reports ~5 falls in past 6 mos         OT Problem List: Decreased cognition      OT Treatment/Interventions:      OT Goals(Current goals can be found in the care plan section) Acute Rehab OT Goals Patient Stated Goal: to go home  OT Goal Formulation: All assessment and education complete, DC therapy  OT Frequency:     Barriers to D/C:            Co-evaluation              AM-PAC PT "6 Clicks" Daily Activity     Outcome Measure Help from another person eating meals?: None Help from another person taking care of personal grooming?: None Help from another person toileting, which includes using toliet, bedpan, or urinal?: None Help from another person bathing (including washing, rinsing, drying)?: None Help from another person to put on and taking off regular upper body clothing?: None Help from another person to put on and taking off regular lower body clothing?: None 6 Click Score: 24   End of Session Nurse Communication: Mobility status  Activity Tolerance: Patient limited by pain Patient left: in chair;with call bell/phone within reach;with family/visitor present  OT Visit Diagnosis: Cognitive communication deficit (R41.841)                Time: 4098-1191 OT Time Calculation (min): 44 min Charges:  OT General Charges $OT Visit: 1 Procedure OT Evaluation $OT Eval Low Complexity: 1 Procedure OT Treatments $Therapeutic Activity: 23-37 mins G-Codes:     [employer], OTR/L [phone]   Katherine Bennett M 12/16/2016, 1:53 PM

## 2016-12-16 NOTE — Discharge Instructions (Signed)

## 2016-12-16 NOTE — Care Management Note (Signed)
Case Management Note  Patient Details  Name: Katherine Bennett MRN: [id number] Date of Birth: 08-28-1940  Subjective/Objective:                    Action/Plan: Pt discharging home with self care. No f/u and no DME per PT/OT. Pt has insurance and PCP. No further needs per CM.   Expected Discharge Date:                  Expected Discharge Plan:  Home/Self Care  In-House Referral:     Discharge planning Services     Post Acute Care Choice:    Choice offered to:     DME Arranged:    DME Agency:     HH Arranged:    HH Agency:     Status of Service:  Completed, signed off  If discussed at [employer] of Stay Meetings, dates discussed:    Additional Comments:  Kermit Balo, RN 12/16/2016, 2:53 PM

## 2016-12-16 NOTE — Progress Notes (Signed)
Occupational Therapy Evaluation Addendum:    2016-12-17 1200  OT G-codes **NOT FOR INPATIENT CLASS**  Functional Assessment Tool Used AM-PAC 6 Clicks Daily Activity  Functional Limitation Self care  Self Care Current Status (V9563) CI  Self Care Goal Status (O7564) CI  Self Care Discharge Status [phone]) CI  Jeani Hawking, OTR/L [phone]

## 2016-12-16 NOTE — Progress Notes (Signed)
Rn spoke to echo tech that stated that they were performing an echo on a patient currently but they would be up to perform Katherine Bennett echo afterward, as long as they did not get any STAT orders instead. Echo tech stated they would notify RN if patient's echo would be delayed. RN informed MD of this and he stated that patient could be discharged after echo performed and that patient's family doctor would follow up with results. Patient and her daughters at bedside informed of this and verbalized understanding.

## 2016-12-16 NOTE — Progress Notes (Signed)
  Speech Language Pathology Patient Details Name: OSWIN JOHAL MRN: [id number] DOB: 1941/01/19 Today's Date: 12/16/2016 Time:  -     Order entered as swallow and under comments states "slurred speech". Reviewed chart; no CVA on MRI and spoke with pt and daughter both report that speech is completely back to baseline and there are no issues related to cognition or swallow. Screen only, no formal eval, no charge.          Royce Macadamia 12/16/2016, 12:11 PM  Breck Coons Lonell Face.Ed [employer] [phone]

## 2016-12-16 NOTE — Discharge Summary (Signed)
[facility]  Physician Discharge Summary   Patient ID: Katherine Bennett MRN: [id number] DOB/AGE: Jan 01, 1941 76 y.o.  Admit date: 12/14/2016 Discharge date: 12/17/2016  PCP: Eliott Nine, MD  DISCHARGE DIAGNOSES:  Principal Problem:   Encephalopathy acute Active Problems:   Acute lower UTI   Hyperglycemia   Malnutrition (HCC)   Elevated LFTs   RECOMMENDATIONS FOR OUTPATIENT FOLLOW UP: 1. Follow-up with primary care physician 2. Mild impaired fasting glucose levels. Will need outpatient monitoring   DISCHARGE CONDITION: fair  Diet recommendation: As before  [facility] Weights   12/14/16 1724 12/14/16 2350  Weight: 77.1 kg (170 lb) 77 kg (169 lb 12.8 oz)    INITIAL HISTORY: 76 y.o.femalewith medical history significant of HTN, HLD, and IBS presenting with altered mental status. Initial CT scan of the brain was negative for any acute infarct. The patient was admitted with encephalopathy from probable urinary tract infection.   Consultations:  None  Procedures:  Transthoracic echocardiogram Study Conclusions  - Left ventricle: The cavity size was normal. Systolic function was   normal. The estimated ejection fraction was in the range of 55%   to 60%. Wall motion was normal; there were no regional wall   motion abnormalities. Left ventricular diastolic function   parameters were normal. - Atrial septum: No defect or patent foramen ovale was identified.   HOSPITAL COURSE:   Acute encephalopathy Likely related to urinary tract infection. Patient did not have any focal neurological deficits. However, there was some mention of slurred speech, so patient underwent MRI. This was negative for stroke. Echocardiogram as above. Mental status is now back to baseline. TSH normal.  Urinary tract infection Urine cultures positive for Escherichia coli. Patient was initially started on ceftriaxone. Sensitivities reviewed. Patient was discharged on  Vantin.  Hyperglycemia Mildly elevated glucose levels noted. He pursued further as outpatient.  Elevated LFTs Mildly elevated LFTs were noted to admission. Subsequent levels are normal. Does not need any further workup at this time.  Overall, stable. Seen by PT and OT. Does not need any outpatient rehabilitation. Okay for discharge.    PERTINENT LABS:  The results of significant diagnostics from this hospitalization (including imaging, microbiology, ancillary and laboratory) are listed below for reference.    Microbiology: Recent Results (from the past 240 hour(s))  Urine culture     Status: Abnormal   Collection Time: 12/14/16  8:21 PM  Result Value Ref Range Status   Specimen Description URINE, RANDOM  Final   Special Requests NONE  Final   Culture >=100,000 COLONIES/mL ESCHERICHIA COLI (A)  Final   Report Status 12/17/2016 FINAL  Final   Organism ID, Bacteria ESCHERICHIA COLI (A)  Final      Susceptibility   Escherichia coli - MIC*    AMPICILLIN >=32 RESISTANT Resistant     CEFAZOLIN 16 SENSITIVE Sensitive     CEFTRIAXONE <=1 SENSITIVE Sensitive     CIPROFLOXACIN <=0.25 SENSITIVE Sensitive     GENTAMICIN <=1 SENSITIVE Sensitive     IMIPENEM <=0.25 SENSITIVE Sensitive     NITROFURANTOIN <=16 SENSITIVE Sensitive     TRIMETH/SULFA <=20 SENSITIVE Sensitive     AMPICILLIN/SULBACTAM >=32 RESISTANT Resistant     PIP/TAZO <=4 SENSITIVE Sensitive     Extended ESBL NEGATIVE Sensitive     * >=100,000 COLONIES/mL ESCHERICHIA COLI     Labs: Basic Metabolic Panel:  Recent Labs Lab 12/14/16 1825 12/14/16 1837 12/15/16 0534 12/16/16 0625  NA 137 136 135 141  K 3.6 3.8 3.5 3.6  CL 103 102 103 106  CO2 25  --  24 27  GLUCOSE 133* 138* 110* 117*  BUN CREATININE 1.00 1.00 0.88 0.77  CALCIUM 9.5  --  9.6 10.0   Liver Function Tests:  Recent Labs Lab 12/14/16 1825 12/16/16 0625  AST 37 23  ALT 52 36  ALKPHOS 135* 104  BILITOT 1.5* 0.7  PROT 6.3* 5.7*   ALBUMIN 2.9* 2.3*   CBC:  Recent Labs Lab 12/14/16 1825 12/14/16 1837 12/15/16 0534 12/16/16 0625  WBC 8.4  --  6.8 5.4  NEUTROABS 7.5  --   --  3.3  HGB 13.3 13.9 12.0 12.0  HCT 41.0 41.0 37.0 37.5  MCV 86.9  --  87.1 87.4  PLT 162  --  148* 179    IMAGING STUDIES Dg Chest 2 View  Result Date: 12/14/2016 CLINICAL DATA:  Headache, dizziness, nausea, vomiting and diarrhea. Fell. Hit left chest on a night table. EXAM: CHEST  2 VIEW COMPARISON:  None. FINDINGS: Normal sized heart. Minimal left basilar atelectasis. Otherwise, clear lungs. Moderate right shoulder degenerative changes. No fracture or pneumothorax seen. IMPRESSION: Minimal left basilar atelectasis. Moderate right shoulder degenerative changes. Electronically Signed   By: Beckie Salts M.D.   On: 12/14/2016 18:34   Ct Head Wo Contrast  Result Date: 12/14/2016 CLINICAL DATA:  Patient fell hitting head against bedroom drawer. EXAM: CT HEAD WITHOUT CONTRAST TECHNIQUE: Contiguous axial images were obtained from the base of the skull through the vertex without intravenous contrast. COMPARISON:  None. FINDINGS: Brain: No evidence of acute infarction, hemorrhage, hydrocephalus, extra-axial collection or mass lesion/mass effect. Vascular: There is mild-to-moderate atherosclerosis of the carotid siphons. No hyperdense vessels. Skull: No skull fracture or bone destruction. Sinuses/Orbits: No acute finding. Other: None. IMPRESSION: No acute intracranial abnormality. Electronically Signed   By: Tollie Eth M.D.   On: 12/14/2016 19:22   Mr Maxine Glenn Head Wo Contrast  Result Date: 12/15/2016 CLINICAL DATA:  Larey Seat with trauma to the head 4 days ago. Leg weakness. Speech disturbance. EXAM: MRI HEAD WITHOUT AND WITH CONTRAST MRA HEAD WITHOUT CONTRAST MRA NECK WITHOUT AND WITH CONTRAST TECHNIQUE: Multiplanar, multiecho pulse sequences of the brain and surrounding structures were obtained without and with intravenous contrast. Angiographic images of the  Circle of Willis were obtained using MRA technique without intravenous contrast. Angiographic images of the neck were obtained using MRA technique without and with intravenous contrast. Carotid stenosis measurements (when applicable) are obtained utilizing NASCET criteria, using the distal internal carotid diameter as the denominator. CONTRAST:  16mL MULTIHANCE GADOBENATE DIMEGLUMINE 529 MG/ML IV SOLN COMPARISON:  CT 12/14/2016 FINDINGS: MRI HEAD FINDINGS Brain: Diffusion imaging does not show any acute or subacute infarction. The brainstem and cerebellum are normal. 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 No cortical or large vessel territory insult. No mass lesion, hemorrhage, hydrocephalus or extra-axial collection. Vascular: Major vessels at the base of the brain show flow. Skull and upper cervical spine: Negative Sinuses/Orbits: Clear/normal Other: None MRA HEAD FINDINGS Both internal carotid arteries are widely patent into the brain. The anterior and middle cerebral vessels are normal without proximal stenosis, aneurysm or vascular malformation. Fetal origin right PCA. Both vertebral arteries are patent with the left being dominant. No basilar stenosis. Posterior circulation branch vessels are normal. MRA NECK FINDINGS Branching pattern of the brachiocephalic vessels from the arch is normal. No origin stenosis. Both common  carotid arteries are widely patent to the bifurcation region. Both carotid bifurcations are normal without stenosis or irregularity. Both cervical internal carotid arteries are normal. Both vertebral arteries are patent. Vertebral artery origins are not well seen because of motion artifact in the chest, but no stenosis is suspected. In the cervical region, both vertebral arteries are widely patent. 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 IMPRESSION:  No acute intracranial finding. No traumatic finding. Moderate chronic small-vessel ischemic changes of the cerebral hemispheric white matter, fairly typical for age. Negative neck MR angiography. No carotid bifurcation disease on either side. 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 Negative intracranial MR angiography of the large and medium size vessels. Electronically Signed   By: Paulina Fusi M.D.   On: 12/15/2016 13:55   Mr Maxine Glenn Neck W Wo Contrast  Result Date: 12/15/2016 CLINICAL DATA:  Larey Seat with trauma to the head 4 days ago. Leg weakness. Speech disturbance. EXAM: MRI HEAD WITHOUT AND WITH CONTRAST MRA HEAD WITHOUT CONTRAST MRA NECK WITHOUT AND WITH CONTRAST TECHNIQUE: Multiplanar, multiecho pulse sequences of the brain and surrounding structures were obtained without and with intravenous contrast. Angiographic images of the Circle of Willis were obtained using MRA technique without intravenous contrast. Angiographic images of the neck were obtained using MRA technique without and with intravenous contrast. Carotid stenosis measurements (when applicable) are obtained utilizing NASCET criteria, using the distal internal carotid diameter as the denominator. CONTRAST:  16mL MULTIHANCE GADOBENATE DIMEGLUMINE 529 MG/ML IV SOLN COMPARISON:  CT 12/14/2016 FINDINGS: MRI HEAD FINDINGS Brain: Diffusion imaging does not show any acute or subacute infarction. The brainstem and cerebellum are normal. 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 No cortical or large vessel territory insult. No mass lesion, hemorrhage, hydrocephalus or extra-axial collection. Vascular: Major vessels at the base of the brain show flow. Skull and upper cervical spine: Negative Sinuses/Orbits: Clear/normal Other: None MRA HEAD FINDINGS Both internal carotid arteries are widely patent into the brain. The anterior and middle  cerebral vessels are normal without proximal stenosis, aneurysm or vascular malformation. Fetal origin right PCA. Both vertebral arteries are patent with the left being dominant. No basilar stenosis. Posterior circulation branch vessels are normal. MRA NECK FINDINGS Branching pattern of the brachiocephalic vessels from the arch is normal. No origin stenosis. Both common carotid arteries are widely patent to the bifurcation region. Both carotid bifurcations are normal without stenosis or irregularity. Both cervical internal carotid arteries are normal. Both vertebral arteries are patent. Vertebral artery origins are not well seen because of motion artifact in the chest, but no stenosis is suspected. In the cervical region, both vertebral arteries are widely patent. 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 IMPRESSION: No acute intracranial finding. No traumatic finding. Moderate chronic small-vessel ischemic changes of the cerebral hemispheric white matter, fairly typical for age. Negative neck MR angiography. No carotid bifurcation disease on either side. 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 Negative intracranial MR angiography of the large and medium size vessels. Electronically Signed   By: Paulina Fusi M.D.   On: 12/15/2016 13:55   Mr Laqueta Jean UJ Contrast  Result Date: 12/15/2016 CLINICAL DATA:  Larey Seat with trauma to the head 4 days ago. Leg weakness. Speech disturbance. EXAM: MRI HEAD WITHOUT AND WITH CONTRAST MRA HEAD WITHOUT CONTRAST MRA NECK WITHOUT AND WITH CONTRAST TECHNIQUE: Multiplanar, multiecho pulse sequences of the brain and surrounding  structures were obtained without and with intravenous contrast. Angiographic images of the Circle of Willis were obtained using MRA technique without intravenous contrast. Angiographic images of the neck were obtained using MRA technique without and with intravenous contrast. Carotid stenosis measurements (when applicable)  are obtained utilizing NASCET criteria, using the distal internal carotid diameter as the denominator. CONTRAST:  16mL MULTIHANCE GADOBENATE DIMEGLUMINE 529 MG/ML IV SOLN COMPARISON:  CT 12/14/2016 FINDINGS: MRI HEAD FINDINGS Brain: Diffusion imaging does not show any acute or subacute infarction. The brainstem and cerebellum are normal. 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 No cortical or large vessel territory insult. No mass lesion, hemorrhage, hydrocephalus or extra-axial collection. Vascular: Major vessels at the base of the brain show flow. Skull and upper cervical spine: Negative Sinuses/Orbits: Clear/normal Other: None MRA HEAD FINDINGS Both internal carotid arteries are widely patent into the brain. The anterior and middle cerebral vessels are normal without proximal stenosis, aneurysm or vascular malformation. Fetal origin right PCA. Both vertebral arteries are patent with the left being dominant. No basilar stenosis. Posterior circulation branch vessels are normal. MRA NECK FINDINGS Branching pattern of the brachiocephalic vessels from the arch is normal. No origin stenosis. Both common carotid arteries are widely patent to the bifurcation region. Both carotid bifurcations are normal without stenosis or irregularity. Both cervical internal carotid arteries are normal. Both vertebral arteries are patent. Vertebral artery origins are not well seen because of motion artifact in the chest, but no stenosis is suspected. In the cervical region, both vertebral arteries are widely patent. 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 IMPRESSION: No acute intracranial finding. No traumatic finding. Moderate chronic small-vessel ischemic changes of the cerebral hemispheric white matter, fairly typical for age. Negative neck MR angiography. No carotid bifurcation disease on  either side. 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 Negative intracranial MR angiography of the large and medium size vessels. Electronically Signed   By: Paulina Fusi M.D.   On: 12/15/2016 13:55    DISCHARGE EXAMINATION: Vitals:   12/15/16 2139 12/16/16 0100 12/16/16 0500 12/16/16 1336  BP: (!) 128/59 (!) 138/52 (!) 113/56 (!) 141/56  Pulse: 78 74 66 72  Resp: Temp: 98 F (36.7 C) 97.9 F (36.6 C) 97.8 F (36.6 C) 98.2 F (36.8 C)  TempSrc: Oral Oral Oral Oral  SpO2: 92% 95% 96% 99%  Weight:      Height:       General appearance: alert, cooperative, appears stated age and no distress Resp: clear to auscultation bilaterally Cardio: regular rate and rhythm, S1, S2 normal, no murmur, click, rub or gallop GI: soft, non-tender; bowel sounds normal; no masses,  no organomegaly  DISPOSITION: Home  Discharge Instructions    Call MD for:  extreme fatigue    Complete by:  As directed    Call MD for:  persistant dizziness or light-headedness    Complete by:  As directed    Call MD for:  persistant nausea and vomiting    Complete by:  As directed    Call MD for:  severe uncontrolled pain    Complete by:  As directed    Call MD for:  temperature >100.4    Complete by:  As directed    Discharge instructions    Complete by:  As directed    Please follow-up with your primary care physician for results of  echocardiogram and final urine culture reports.  You were cared for by a hospitalist during your hospital stay. If you have any questions about your discharge medications or the care you received while you were in the hospital after you are discharged, you can call the unit and asked to speak with the hospitalist on call if the hospitalist that took care of you is not available. Once you are discharged, your primary care physician will handle any further medical issues. Please note that NO REFILLS for any discharge medications will be authorized once you are  discharged, as it is imperative that you return to your primary care physician (or establish a relationship with a primary care physician if you do not have one) for your aftercare needs so that they can reassess your need for medications and monitor your lab values. If you do not have a primary care physician, you can call [phone] for a physician referral.   Increase activity slowly    Complete by:  As directed       ALLERGIES:  Allergies  Allergen Reactions  . Morphine And Related Other (See Comments)    hallucinations      Discharge Medication List as of 12/16/2016  4:06 PM    START taking these medications   Details  cefpodoxime (VANTIN) 200 MG tablet Take 1 tablet (200 mg total) by mouth every 12 (twelve) hours., Starting Wed 12/16/2016, Until Mon 12/21/2016, Normal    naproxen (NAPROSYN) 250 MG tablet Take 1 tablet (250 mg total) by mouth 2 (two) times daily as needed (for back pain)., Starting Wed 12/16/2016, Normal      CONTINUE these medications which have NOT CHANGED   Details  acetaminophen (TYLENOL) 500 MG tablet Take 1,000 mg by mouth every 6 (six) hours as needed for headache (pain)., Historical Med    aspirin EC 81 MG tablet Take 81 mg by mouth at bedtime. , Historical Med    cetirizine (ZYRTEC) 10 MG tablet Take 10 mg by mouth daily., Historical Med    Cholecalciferol (VITAMIN D3) 3000 units TABS Take 3,000 Units by mouth daily., Historical Med    fluticasone (FLONASE) 50 MCG/ACT nasal spray Place 1 spray into both nostrils daily as needed (seasonal allergies). , Historical Med    furosemide (LASIX) 20 MG tablet Take 1 tablet (20 mg total) by mouth daily., Starting Wed 08/07/2015, Normal    losartan (COZAAR) 100 MG tablet Take 1 tablet (100 mg total) by mouth daily., Starting Mon 03/02/2016, Normal    simvastatin (ZOCOR) 80 MG tablet Take 80 mg by mouth at bedtime., Historical Med         Follow-up Information    Eliott Nine, MD. Go on 12/23/2016.    Specialty:  Family Medicine Why:  for results of Echocardiogram, urine culture, and post hospital stay follow up.  Your followup appointment is on 12/23/2016 at 10:00 AM. Contact information: [address] [place] [address] [phone]           TOTAL DISCHARGE TIME: 35 mins  [facility]  [facility] Pager [phone]  12/17/2016, 12:43 PM

## 2016-12-16 NOTE — Progress Notes (Signed)
RN spoke to representative from echo department and notified them that per Dr Rito Ehrlich patient can be discharged after her echocardiogram today. Echo department stated that they would note information. Dr Rito Ehrlich instructed RN to update him later today if patient still had not had echo completed.

## 2016-12-17 LAB — URINE CULTURE

## 2016-12-20 NOTE — ED Provider Notes (Signed)
[facility]-EMERGENCY DEPT Provider Note   CSN: [id number] Arrival date & time: 12/14/16  1721     History   Chief Complaint Chief Complaint  Patient presents with  . Stroke Symptoms  . Diarrhea    HPI Katherine Bennett is a 76 y.o. female.  HPI 76 y.o.femalewith medical history significant of HTN, HLD, and IBS presenting with altered mental status. Pt's family reports that pt has been weak for the past several days, and over the weekend she has had increased confusion and when she was walking she was leaning to the L side and needed assistance. Pt also had a fall few days back. Pt is profoundly weak and needing help with regular chores.  Past Medical History:  Diagnosis Date  . Erb's palsy   . History of squamous cell carcinoma    followed by dermatology  . Hyperlipidemia   . Hypertension   . IBS (irritable bowel syndrome)   . Obesity   . Osteoporosis 2012    Patient Active Problem List   Diagnosis Date Noted  . Encephalopathy acute 12/14/2016  . Acute lower UTI 12/14/2016  . Hyperglycemia 12/14/2016  . Malnutrition (HCC) 12/14/2016  . Elevated LFTs 12/14/2016  . Osteoporosis 07/01/2015  . Essential hypertension, benign 05/09/2015  . Vitamin D deficiency 05/09/2015  . Hyperparathyroidism (HCC) 05/09/2015  . Hypercalcemia 05/09/2015    Past Surgical History:  Procedure Laterality Date  . ANKLE FRACTURE SURGERY Right   . APPENDECTOMY    . CHOLECYSTECTOMY    . PARTIAL HYSTERECTOMY    . THYROIDECTOMY, PARTIAL     Left thyroid   . TONSILLECTOMY      OB History    No data available       Home Medications    Prior to Admission medications   Medication Sig Start Date End Date Taking? Authorizing Provider  acetaminophen (TYLENOL) 500 MG tablet Take 1,000 mg by mouth every 6 (six) hours as needed for headache (pain).   Yes [provider]  aspirin EC 81 MG tablet Take 81 mg by mouth at bedtime.    Yes [provider]  cetirizine (ZYRTEC) 10  MG tablet Take 10 mg by mouth daily.   Yes [provider]  Cholecalciferol (VITAMIN D3) 3000 units TABS Take 3,000 Units by mouth daily.   Yes [provider]  fluticasone (FLONASE) 50 MCG/ACT nasal spray Place 1 spray into both nostrils daily as needed (seasonal allergies).    Yes [provider]  furosemide (LASIX) 20 MG tablet Take 1 tablet (20 mg total) by mouth daily. 08/07/15  Yes Carlus Pavlov, MD  losartan (COZAAR) 100 MG tablet Take 1 tablet (100 mg total) by mouth daily. 03/02/16  Yes Carlus Pavlov, MD  simvastatin (ZOCOR) 80 MG tablet Take 80 mg by mouth at bedtime.   Yes [provider]  cefpodoxime (VANTIN) 200 MG tablet Take 1 tablet (200 mg total) by mouth every 12 (twelve) hours. 12/16/16 12/21/16  Osvaldo Shipper, MD  naproxen (NAPROSYN) 250 MG tablet Take 1 tablet (250 mg total) by mouth 2 (two) times daily as needed (for back pain). 12/16/16   Osvaldo Shipper, MD    Family History Family History  Problem Relation Age of Onset  . Heart disease Mother   . Diabetes Mother   . Hypertension Mother   . Stroke Mother     carotid stenosis    Social History Social History  Substance Use Topics  . 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 Smokeless tobacco: Never Used  . Alcohol use 0.0 oz/week     Comment: occasional wine or mimosa     Allergies   Morphine and related   Review of Systems Review of Systems  Constitutional: Positive for activity change.  Neurological: Positive for speech difficulty and weakness.  Psychiatric/Behavioral: Positive for confusion.  All other systems reviewed and are negative.    Physical Exam Updated Vital Signs BP (!) 141/56 (BP Location: Left Arm)   Pulse 72   Temp 98.2 F (36.8 C) (Oral)   Resp 20   Ht 5\' 3"  (1.6 m)   Wt 169 lb 12.8 oz (77 kg)   SpO2 99%   BMI 30.08 kg/m   Physical Exam  Constitutional: She is oriented to person, place, and time. She appears  well-developed and well-nourished.  HENT:  Head: Normocephalic and atraumatic.  Eyes: EOM are normal. Pupils are equal, round, and reactive to light.  Neck: Neck supple.  Cardiovascular: Normal rate, regular rhythm and normal heart sounds.   No murmur heard. Pulmonary/Chest: Effort normal. No respiratory distress.  Abdominal: Soft. She exhibits no distension. There is no tenderness. There is no rebound and no guarding.  Neurological: She is alert and oriented to person, place, and time. No cranial nerve deficit. Coordination normal.  Slight drift of the RUE, otherwise neg exam  Skin: Skin is warm and dry.  Nursing note and vitals reviewed.    ED Treatments / Results  Labs (all labs ordered are listed, but only abnormal results are displayed) Labs Reviewed  URINE CULTURE - Abnormal; Notable for the following:       Result Value   Culture >=100,000 COLONIES/mL ESCHERICHIA COLI (*)    Organism ID, Bacteria ESCHERICHIA COLI (*)    All other components within normal limits  DIFFERENTIAL - Abnormal; Notable for the following:    Lymphs Abs 0.4 (*)    All other components within normal limits  COMPREHENSIVE METABOLIC PANEL - Abnormal; Notable for the following:    Glucose, Bld 133 (*)    Total Protein 6.3 (*)    Albumin 2.9 (*)    Alkaline Phosphatase 135 (*)    Total Bilirubin 1.5 (*)    GFR calc non Af Amer 53 (*)    All other components within normal limits  URINALYSIS, ROUTINE W REFLEX MICROSCOPIC - Abnormal; Notable for the following:    Color, Urine AMBER (*)    APPearance CLOUDY (*)    Hgb urine dipstick MODERATE (*)    Ketones, ur 5 (*)    Protein, ur 100 (*)    Nitrite POSITIVE (*)    Leukocytes, UA MODERATE (*)    Bacteria, UA MANY (*)    Squamous Epithelial / LPF TOO NUMEROUS TO COUNT (*)    All other components within normal limits  BASIC METABOLIC PANEL - Abnormal; Notable for the following:    Glucose, Bld 110 (*)    All other components within normal limits    CBC - Abnormal; Notable for the following:    Platelets 148 (*)    All other components within normal limits  COMPREHENSIVE METABOLIC PANEL - Abnormal; Notable for the following:    Glucose, Bld 117 (*)    Total Protein 5.7 (*)    Albumin 2.3 (*)    All other components within normal limits  I-STAT CHEM 8, ED - Abnormal; Notable for the following:    Glucose, Bld 138 (*)    All other  components within normal limits  PROTIME-INR  APTT  CBC  RAPID URINE DRUG SCREEN, HOSP PERFORMED  TSH  CBC WITH DIFFERENTIAL/PLATELET  I-STAT TROPOININ, ED    EKG  EKG Interpretation  Date/Time:  Monday December 14 2016 17:27:11 EDT Ventricular Rate:  91 PR Interval:    QRS Duration: 75 QT Interval:  345 QTC Calculation: 425 R Axis:   -3 Text Interpretation:  Sinus rhythm No acute changes No significant change since last tracing Confirmed by Rhunette CroftNANAVATI, MD, Janey GentaANKIT [phone]([id number]) on 12/14/2016 5:46:42 PM Also confirmed by Rhunette CroftNANAVATI, MD, Janey GentaANKIT [phone]([id number]), editor [place]  CCT, BEVERLY ([id number])  on 12/15/2016 8:01:32 AM       Radiology No results found.  Procedures Procedures (including critical care time)  Medications Ordered in ED Medications  cefTRIAXone (ROCEPHIN) 1 g in dextrose 5 % 50 mL IVPB (0 g Intravenous Stopped 12/15/16 0013)  LORazepam (ATIVAN) injection 0.5 mg (0.5 mg Intravenous Given 12/15/16 1007)  gadobenate dimeglumine (MULTIHANCE) injection 16 mL (16 mLs Intravenous Contrast Given 12/15/16 1300)     Initial Impression / Assessment and Plan / ED Course  I have reviewed the triage vital signs and the nursing notes.  Pertinent labs & imaging results that were available during my care of the patient were reviewed by me and considered in my medical decision making (see chart for details).     Pt comes in with weakness. Neuro exam shows very subtle R sided drift, but otherwise there is no focal neuro deficits. Pt also has had weakness, slurred speech, ataxia at home - symptoms not specific to  vascular territory. UA resulted several hours into her stay and is +  - so eventually I think she is having a urinary infection and we will give her ceftriaxone. We will admit. Blood cultured deemed not necessary given the significant improvement here.  Final Clinical Impressions(s) / ED Diagnoses   Final diagnoses:  Abdominal pain  Cystitis    New Prescriptions Discharge Medication List as of 12/16/2016  4:06 PM    START taking these medications   Details  cefpodoxime (VANTIN) 200 MG tablet Take 1 tablet (200 mg total) by mouth every 12 (twelve) hours., Starting Wed 12/16/2016, Until Mon 12/21/2016, Normal    naproxen (NAPROSYN) 250 MG tablet Take 1 tablet (250 mg total) by mouth 2 (two) times daily as needed (for back pain)., Starting Wed 12/16/2016, Normal         Derwood KaplanNanavati, Ladarion Munyon, MD 12/20/16 [phone]

## 2019-03-15 ENCOUNTER — Other Ambulatory Visit: Payer: Self-pay

## 2019-03-15 DIAGNOSIS — Z20822 Contact with and (suspected) exposure to covid-19: Secondary | ICD-10-CM

## 2019-03-16 LAB — NOVEL CORONAVIRUS, NAA: SARS-CoV-2, NAA: NOT DETECTED

## 2019-08-27 ENCOUNTER — Ambulatory Visit: Payer: Medicare Other | Attending: Internal Medicine

## 2019-08-27 DIAGNOSIS — Z23 Encounter for immunization: Secondary | ICD-10-CM | POA: Insufficient documentation

## 2019-08-27 NOTE — Progress Notes (Signed)
   Covid-19 Vaccination Clinic  Name:  Katherine Bennett    MRN: [id number] DOB: 1941-02-18  08/27/2019  Katherine Bennett was observed post Covid-19 immunization for 15 minutes without incidence. She was provided with Vaccine Information Sheet and instruction to access the V-Safe system.   Katherine Bennett was instructed to call 911 with any severe reactions post vaccine: Marland Kitchen Difficulty breathing  . Swelling of your face and throat  . A fast heartbeat  . A bad rash all over your body  . Dizziness and weakness    Immunizations Administered    Name Date Dose VIS Date Route   Pfizer COVID-19 Vaccine 08/27/2019 10:47 AM 0.3 mL 07/28/2019 Intramuscular   Manufacturer: [employer], [employer]   Lot: [id number]   NDC: [address]-1

## 2019-09-15 ENCOUNTER — Ambulatory Visit: Payer: Medicare Other

## 2019-09-16 ENCOUNTER — Ambulatory Visit: Payer: Medicare Other | Attending: Internal Medicine

## 2019-09-16 DIAGNOSIS — Z23 Encounter for immunization: Secondary | ICD-10-CM | POA: Insufficient documentation

## 2019-09-16 NOTE — Progress Notes (Signed)
   Covid-19 Vaccination Clinic  Name:  Katherine Bennett    MRN: [id number] DOB: [birth year]  09/16/2019  [name] was observed post Covid-19 immunization for 15 minutes without incidence. She was provided with Vaccine Information Sheet and instruction to access the V-Safe system.   Ms. Ost was instructed to call 911 with any severe reactions post vaccine: Marland Kitchen Difficulty breathing  . Swelling of your face and throat  . A fast heartbeat  . A bad rash all over your body  . Dizziness and weakness    Immunizations Administered    Name Date Dose VIS Date Route   Pfizer COVID-19 Vaccine 09/16/2019  1:53 PM 0.3 mL 07/28/2019 Intramuscular   Manufacturer: [employer], [employer]   Lot: [id number]   NDC: [address]-5

## 2024-06-02 ENCOUNTER — Other Ambulatory Visit: Payer: Self-pay

## 2024-06-02 ENCOUNTER — Emergency Department (HOSPITAL_COMMUNITY)

## 2024-06-02 ENCOUNTER — Encounter (HOSPITAL_COMMUNITY): Payer: Self-pay

## 2024-06-02 ENCOUNTER — Inpatient Hospital Stay (HOSPITAL_COMMUNITY)
Admission: EM | Admit: 2024-06-02 | Discharge: 2024-06-08 | DRG: 314 | Disposition: A | Discharge status: Home Health | Attending: Internal Medicine | Admitting: Internal Medicine

## 2024-06-02 DIAGNOSIS — B9729 Other coronavirus as the cause of diseases classified elsewhere: Secondary | ICD-10-CM | POA: Diagnosis present

## 2024-06-02 DIAGNOSIS — Z885 Allergy status to narcotic agent status: Secondary | ICD-10-CM

## 2024-06-02 DIAGNOSIS — I3139 Other pericardial effusion (noninflammatory): Secondary | ICD-10-CM | POA: Diagnosis not present

## 2024-06-02 DIAGNOSIS — Z7982 Long term (current) use of aspirin: Secondary | ICD-10-CM | POA: Diagnosis not present

## 2024-06-02 DIAGNOSIS — E669 Obesity, unspecified: Secondary | ICD-10-CM | POA: Diagnosis present

## 2024-06-02 DIAGNOSIS — R110 Nausea: Secondary | ICD-10-CM | POA: Diagnosis present

## 2024-06-02 DIAGNOSIS — J918 Pleural effusion in other conditions classified elsewhere: Secondary | ICD-10-CM | POA: Diagnosis present

## 2024-06-02 DIAGNOSIS — Z888 Allergy status to other drugs, medicaments and biological substances status: Secondary | ICD-10-CM

## 2024-06-02 DIAGNOSIS — U071 COVID-19: Secondary | ICD-10-CM | POA: Diagnosis not present

## 2024-06-02 DIAGNOSIS — Z833 Family history of diabetes mellitus: Secondary | ICD-10-CM | POA: Diagnosis not present

## 2024-06-02 DIAGNOSIS — E876 Hypokalemia: Secondary | ICD-10-CM | POA: Diagnosis not present

## 2024-06-02 DIAGNOSIS — K589 Irritable bowel syndrome without diarrhea: Secondary | ICD-10-CM | POA: Diagnosis present

## 2024-06-02 DIAGNOSIS — I309 Acute pericarditis, unspecified: Principal | ICD-10-CM | POA: Diagnosis present

## 2024-06-02 DIAGNOSIS — Z823 Family history of stroke: Secondary | ICD-10-CM | POA: Diagnosis not present

## 2024-06-02 DIAGNOSIS — R197 Diarrhea, unspecified: Secondary | ICD-10-CM | POA: Diagnosis present

## 2024-06-02 DIAGNOSIS — I10 Essential (primary) hypertension: Secondary | ICD-10-CM | POA: Diagnosis present

## 2024-06-02 DIAGNOSIS — Z87891 Personal history of nicotine dependence: Secondary | ICD-10-CM

## 2024-06-02 DIAGNOSIS — J189 Pneumonia, unspecified organism: Secondary | ICD-10-CM | POA: Diagnosis not present

## 2024-06-02 DIAGNOSIS — N179 Acute kidney failure, unspecified: Secondary | ICD-10-CM | POA: Diagnosis not present

## 2024-06-02 DIAGNOSIS — Z8249 Family history of ischemic heart disease and other diseases of the circulatory system: Secondary | ICD-10-CM

## 2024-06-02 DIAGNOSIS — J154 Pneumonia due to other streptococci: Secondary | ICD-10-CM | POA: Diagnosis present

## 2024-06-02 DIAGNOSIS — E1122 Type 2 diabetes mellitus with diabetic chronic kidney disease: Secondary | ICD-10-CM | POA: Diagnosis present

## 2024-06-02 DIAGNOSIS — Z7984 Long term (current) use of oral hypoglycemic drugs: Secondary | ICD-10-CM | POA: Diagnosis not present

## 2024-06-02 DIAGNOSIS — J9811 Atelectasis: Secondary | ICD-10-CM | POA: Diagnosis present

## 2024-06-02 DIAGNOSIS — J9601 Acute respiratory failure with hypoxia: Principal | ICD-10-CM | POA: Diagnosis present

## 2024-06-02 DIAGNOSIS — Z1152 Encounter for screening for COVID-19: Secondary | ICD-10-CM

## 2024-06-02 DIAGNOSIS — J90 Pleural effusion, not elsewhere classified: Secondary | ICD-10-CM | POA: Diagnosis not present

## 2024-06-02 DIAGNOSIS — B342 Coronavirus infection, unspecified: Secondary | ICD-10-CM | POA: Diagnosis not present

## 2024-06-02 DIAGNOSIS — I314 Cardiac tamponade: Secondary | ICD-10-CM | POA: Diagnosis present

## 2024-06-02 DIAGNOSIS — I301 Infective pericarditis: Secondary | ICD-10-CM | POA: Diagnosis not present

## 2024-06-02 DIAGNOSIS — Z79899 Other long term (current) drug therapy: Secondary | ICD-10-CM

## 2024-06-02 DIAGNOSIS — I2781 Cor pulmonale (chronic): Secondary | ICD-10-CM | POA: Diagnosis not present

## 2024-06-02 DIAGNOSIS — I480 Paroxysmal atrial fibrillation: Secondary | ICD-10-CM | POA: Diagnosis present

## 2024-06-02 DIAGNOSIS — E785 Hyperlipidemia, unspecified: Secondary | ICD-10-CM | POA: Diagnosis present

## 2024-06-02 LAB — CBC WITH DIFFERENTIAL/PLATELET
Abs Immature Granulocytes: 0.02 K/uL (ref 0.00–0.07)
Basophils Absolute: 0 K/uL (ref 0.0–0.1)
Basophils Relative: 0 %
Eosinophils Absolute: 0 K/uL (ref 0.0–0.5)
Eosinophils Relative: 0 %
HCT: 42 % (ref 36.0–46.0)
Hemoglobin: 12.4 g/dL (ref 12.0–15.0)
Immature Granulocytes: 0 %
Lymphocytes Relative: 13 %
Lymphs Abs: 1.4 K/uL (ref 0.7–4.0)
MCH: 25.5 pg — ABNORMAL LOW (ref 26.0–34.0)
MCHC: 29.5 g/dL — ABNORMAL LOW (ref 30.0–36.0)
MCV: 86.4 fL (ref 80.0–100.0)
Monocytes Absolute: 1 K/uL (ref 0.1–1.0)
Monocytes Relative: 10 %
Neutro Abs: 8.3 K/uL — ABNORMAL HIGH (ref 1.7–7.7)
Neutrophils Relative %: 77 %
Platelets: 303 K/uL (ref 150–400)
RBC: 4.86 MIL/uL (ref 3.87–5.11)
RDW: 13.5 % (ref 11.5–15.5)
WBC: 10.8 K/uL — ABNORMAL HIGH (ref 4.0–10.5)
nRBC: 0 % (ref 0.0–0.2)

## 2024-06-02 LAB — I-STAT CHEM 8, ED
BUN: 16 mg/dL (ref 8–23)
Calcium, Ion: 1.15 mmol/L (ref 1.15–1.40)
Chloride: 103 mmol/L (ref 98–111)
Creatinine, Ser: 1 mg/dL (ref 0.44–1.00)
Glucose, Bld: 160 mg/dL — ABNORMAL HIGH (ref 70–99)
HCT: 40 % (ref 36.0–46.0)
Hemoglobin: 13.6 g/dL (ref 12.0–15.0)
Potassium: 4 mmol/L (ref 3.5–5.1)
Sodium: 140 mmol/L (ref 135–145)
TCO2: 22 mmol/L (ref 22–32)

## 2024-06-02 LAB — LIPASE, BLOOD: Lipase: 12 U/L (ref 11–51)

## 2024-06-02 LAB — COMPREHENSIVE METABOLIC PANEL WITH GFR
ALT: 18 U/L (ref 0–44)
AST: 18 U/L (ref 15–41)
Albumin: 4.1 g/dL (ref 3.5–5.0)
Alkaline Phosphatase: 118 U/L (ref 38–126)
Anion gap: 13 (ref 5–15)
BUN: 16 mg/dL (ref 8–23)
CO2: 22 mmol/L (ref 22–32)
Calcium: 9.3 mg/dL (ref 8.9–10.3)
Chloride: 103 mmol/L (ref 98–111)
Creatinine, Ser: 0.89 mg/dL (ref 0.44–1.00)
GFR, Estimated: >60 mL/min (ref >60)
Glucose, Bld: 160 mg/dL — ABNORMAL HIGH (ref 70–99)
Potassium: 4 mmol/L (ref 3.5–5.1)
Sodium: 138 mmol/L (ref 135–145)
Total Bilirubin: 1.6 mg/dL — ABNORMAL HIGH (ref 0.0–1.2)
Total Protein: 6.7 g/dL (ref 6.5–8.1)

## 2024-06-02 LAB — RESP PANEL BY RT-PCR (RSV, FLU A&B, COVID)  RVPGX2
Influenza A by PCR: NEGATIVE
Influenza B by PCR: NEGATIVE
Resp Syncytial Virus by PCR: NEGATIVE
SARS Coronavirus 2 by RT PCR: NEGATIVE

## 2024-06-02 LAB — BLOOD GAS, VENOUS
Acid-Base Excess: 1.3 mmol/L (ref 0.0–2.0)
Bicarbonate: 27.2 mmol/L (ref 20.0–28.0)
O2 Saturation: 72.3 %
Patient temperature: 37
pCO2, Ven: 47 mmHg (ref 44–60)
pH, Ven: 7.37 (ref 7.25–7.43)
pO2, Ven: 41 mmHg (ref 32–45)

## 2024-06-02 LAB — TROPONIN T, HIGH SENSITIVITY
Troponin T High Sensitivity: 15 ng/L (ref 0–19)
Troponin T High Sensitivity: 16 ng/L (ref 0–19)

## 2024-06-02 LAB — I-STAT CG4 LACTIC ACID, ED
Lactic Acid, Venous: 1.2 mmol/L (ref 0.5–1.9)
Lactic Acid, Venous: 1.7 mmol/L (ref 0.5–1.9)

## 2024-06-02 LAB — CK: Total CK: 63 U/L (ref 38–234)

## 2024-06-02 LAB — PRO BRAIN NATRIURETIC PEPTIDE: Pro Brain Natriuretic Peptide: 268 pg/mL (ref <300.0)

## 2024-06-02 MED ORDER — ACETAMINOPHEN 650 MG RE SUPP: 650.0000 mg | Freq: Four times a day (QID) | RECTAL | Status: DC | PRN

## 2024-06-02 MED ORDER — ONDANSETRON HCL 4 MG/2ML IJ SOLN
4.0000 mg | Freq: Once | INTRAMUSCULAR | Status: AC
  Administered 2024-06-02: 4 mg via INTRAVENOUS
  Filled 2024-06-02: qty 2

## 2024-06-02 MED ORDER — SODIUM CHLORIDE 0.9% FLUSH
3.0000 mL | INTRAVENOUS | Status: DC | PRN
Start: 2024-06-02 — End: 2024-06-05

## 2024-06-02 MED ORDER — VITAMIN D 25 MCG (1000 UNIT) PO TABS
3000.0000 [IU] | ORAL_TABLET | Freq: Every day | ORAL | Status: DC
  Administered 2024-06-03 – 2024-06-08 (×6): 3000 [IU] via ORAL
  Filled 2024-06-02 (×7): qty 3

## 2024-06-02 MED ORDER — ACETAMINOPHEN 325 MG PO TABS
650.0000 mg | ORAL_TABLET | Freq: Four times a day (QID) | ORAL | Status: DC | PRN
  Administered 2024-06-03 – 2024-06-04 (×2): 650 mg via ORAL
  Filled 2024-06-02 (×4): qty 2

## 2024-06-02 MED ORDER — FLUTICASONE PROPIONATE 50 MCG/ACT NA SUSP: 1.0000 | Freq: Every day | NASAL | Status: DC | PRN

## 2024-06-02 MED ORDER — LORATADINE 10 MG PO TABS: 10.0000 mg | ORAL_TABLET | Freq: Every day | ORAL | Status: DC

## 2024-06-02 MED ORDER — LOSARTAN POTASSIUM 50 MG PO TABS: 100.0000 mg | ORAL_TABLET | Freq: Every day | ORAL | Status: DC

## 2024-06-02 MED ORDER — ASPIRIN 81 MG PO TBEC
81.0000 mg | DELAYED_RELEASE_TABLET | Freq: Every day | ORAL | Status: DC
Start: 2024-06-02 — End: 2024-06-05
  Administered 2024-06-02 – 2024-06-05 (×4): 81 mg via ORAL
  Filled 2024-06-02 (×4): qty 1

## 2024-06-02 MED ORDER — ENOXAPARIN SODIUM 40 MG/0.4ML IJ SOSY
40.0000 mg | PREFILLED_SYRINGE | Freq: Every day | INTRAMUSCULAR | Status: DC
  Administered 2024-06-02 – 2024-06-04 (×3): 40 mg via SUBCUTANEOUS
  Filled 2024-06-02 (×3): qty 0.4

## 2024-06-02 MED ORDER — IOHEXOL 350 MG/ML SOLN
100.0000 mL | Freq: Once | INTRAVENOUS | Status: AC | PRN
  Administered 2024-06-02: 100 mL via INTRAVENOUS

## 2024-06-02 MED ORDER — FUROSEMIDE 20 MG PO TABS: 20.0000 mg | ORAL_TABLET | Freq: Every day | ORAL | Status: DC | PRN

## 2024-06-02 MED ORDER — SODIUM CHLORIDE 0.9 % IV SOLN: 500.0000 mg | INTRAVENOUS | Status: DC

## 2024-06-02 MED ORDER — SODIUM CHLORIDE 0.9 % IV SOLN
500.0000 mg | Freq: Once | INTRAVENOUS | Status: AC
  Administered 2024-06-02: 500 mg via INTRAVENOUS
  Filled 2024-06-02: qty 5

## 2024-06-02 MED ORDER — AMLODIPINE BESYLATE 10 MG PO TABS
5.0000 mg | ORAL_TABLET | Freq: Every day | ORAL | Status: DC
  Administered 2024-06-03 – 2024-06-04 (×2): 5 mg via ORAL
  Filled 2024-06-02 (×2): qty 1

## 2024-06-02 MED ORDER — SODIUM CHLORIDE 0.9 % IV SOLN: 250.0000 mL | INTRAVENOUS | Status: AC | PRN

## 2024-06-02 MED ORDER — CINACALCET HCL 30 MG PO TABS
60.0000 mg | ORAL_TABLET | Freq: Every day | ORAL | Status: DC
Start: 2024-06-02 — End: 2024-06-08
  Administered 2024-06-02 – 2024-06-07 (×6): 60 mg via ORAL
  Filled 2024-06-02 (×8): qty 2

## 2024-06-02 MED ORDER — SODIUM CHLORIDE 0.9 % IV SOLN: 1.0000 g | INTRAVENOUS | Status: DC

## 2024-06-02 MED ORDER — SODIUM CHLORIDE 0.9 % IV SOLN
1.0000 g | Freq: Once | INTRAVENOUS | Status: AC
  Administered 2024-06-02: 1 g via INTRAVENOUS
  Filled 2024-06-02: qty 10

## 2024-06-02 MED ORDER — HYDRALAZINE HCL 20 MG/ML IJ SOLN
5.0000 mg | Freq: Four times a day (QID) | INTRAMUSCULAR | Status: DC | PRN
  Administered 2024-06-03: 5 mg via INTRAVENOUS
  Filled 2024-06-02: qty 1

## 2024-06-02 MED ORDER — SODIUM CHLORIDE 0.9% FLUSH
3.0000 mL | Freq: Two times a day (BID) | INTRAVENOUS | Status: DC
Start: 2024-06-02 — End: 2024-06-05
  Administered 2024-06-02 – 2024-06-04 (×4): 3 mL via INTRAVENOUS

## 2024-06-02 MED ORDER — IPRATROPIUM-ALBUTEROL 0.5-2.5 (3) MG/3ML IN SOLN
3.0000 mL | RESPIRATORY_TRACT | Status: DC | PRN
  Administered 2024-06-02: 3 mL via RESPIRATORY_TRACT
  Filled 2024-06-02: qty 3

## 2024-06-02 MED ORDER — ONDANSETRON HCL 4 MG/2ML IJ SOLN
4.0000 mg | Freq: Four times a day (QID) | INTRAMUSCULAR | Status: DC | PRN
  Administered 2024-06-03: 4 mg via INTRAVENOUS
  Filled 2024-06-02: qty 2

## 2024-06-02 MED ORDER — ONDANSETRON HCL 4 MG PO TABS
4.0000 mg | ORAL_TABLET | Freq: Four times a day (QID) | ORAL | Status: DC | PRN
  Administered 2024-06-04: 4 mg via ORAL
  Filled 2024-06-02: qty 1

## 2024-06-02 MED ORDER — ACETAMINOPHEN 500 MG PO TABS
1000.0000 mg | ORAL_TABLET | Freq: Four times a day (QID) | ORAL | Status: DC | PRN
  Administered 2024-06-02 – 2024-06-05 (×3): 1000 mg via ORAL
  Filled 2024-06-02 (×3): qty 2

## 2024-06-02 NOTE — H&P (Addendum)
 [facility] History and Physical  Katherine Bennett FMW:[id number] DOB: 20-Aug-1940 DOA: 06/02/2024  Referring physician: ED  PCP: Joneen Nest, MD   Patient is coming from: Home  Chief Complaint: Cough shortness of breath, nausea, vomiting  HPI: Patient is a 83 years old female with past medical history of hyperlipidemia hypertension irritable bowel syndrome obesity and osteoporosis presented to hospital with complaints of nausea vomiting and diarrhea for the last 4 days with shortness of breath nonproductive cough for almost a week or so.  She also had generalized aches, chills and subjective fever at home.  For the last 1 day, she had increasing shortness of breath especially on ambulation and EMS was called and who noted hypoxia.  Patient was put on supplemental oxygen given Zofran  IV fluid bolus and was brought into the hospital for further evaluation and treatment.  In the ED patient was tachycardic, was tachypneic and tachycardic required 8 L of high flow nasal cannula oxygen to keep saturation in the 90s patient was afebrile in the ED but had taken Tylenol  prior to coming to the hospital.  Labs were notable for WBC slightly elevated at 10.8 with hemoglobin at 12.4.  Lactic acid was 1.2 BMP was notable for glucose elevation at 160 proBNP 268 with a lipase of 12 and COVID influenza and RSV was negative.  VBG was within normal range.  Chest x-ray done in the ED showed bilateral perihilar interstitial opacities which represent possible atypical viral infection.  Hazy airspace opacities bilateral lungs as well.  In the ED patient was given azithromycin, Rocephin  blood cultures were drawn and patient was then considered for admission to hospital for further evaluation and treatment.  Assessment and Plan Principal Problem:   Pneumonia   Bilateral perihilar pneumonia.  Very likely to be viral related with constellation of symptoms.  Will however send urinary Legionella antigen  and Streptococcus pneumonia.  Will treat with antibiotic for now with Rocephin  and Zithromax.  Follow cultures.  Continue DuoNebs.  Continue to wean oxygen as able.  Also get respiratory viral panel.  Currently on 8 L of high flow nasal cannula oxygen.   History of hyperlipidemia On Crestor at home.  Due to multiple muscle symptoms will hold for now.  Check CK levels as well.  Hypertension On amlodipine and losartan  at home will continue.  Continue to monitor blood pressure  Diabetes mellitus type 2.  Hemoglobin A1c of 6.8, 70-month back.  On metformin at home.  Will put the patient on sliding scale insulin.  DVT Prophylaxis: Lovenox  subcu  Review of Systems:  All systems were reviewed and were negative unless otherwise mentioned in the HPI   Past Medical History:  Diagnosis Date   Erb's palsy    History of squamous cell carcinoma    followed by dermatology   Hyperlipidemia    Hypertension    IBS (irritable bowel syndrome)    Obesity    Osteoporosis 2012   Past Surgical History:  Procedure Laterality Date   ANKLE FRACTURE SURGERY Right    APPENDECTOMY     CHOLECYSTECTOMY     PARTIAL HYSTERECTOMY     THYROIDECTOMY, PARTIAL     Left thyroid     TONSILLECTOMY      Social History:  reports that she quit smoking about 41 years ago. She has never used smokeless tobacco. She reports current alcohol use. She reports that she does not use drugs.  Allergies  Allergen Reactions   Losartan  Other (See Comments)  Persistent hyperkalemia   Morphine     Delusions (intolerance), Psychosis  States it made her crazy feeling   States it made her crazy feeling   Morphine And Codeine Other (See Comments)    hallucinations   Spironolactone Other (See Comments)    Hyperkalemia    Family History  Problem Relation Age of Onset   Heart disease Mother    Diabetes Mother    Hypertension Mother    Stroke Mother        carotid stenosis     Prior to Admission medications    Medication Sig Start Date End Date Taking? Authorizing Provider  amLODipine (NORVASC) 5 MG tablet Take 5 mg by mouth in the morning and at bedtime.   Yes [provider]  cinacalcet (SENSIPAR) 60 MG tablet Take 60 mg by mouth at bedtime.   Yes [provider]  metFORMIN (GLUCOPHAGE-XR) 500 MG 24 hr tablet Take 500 mg by mouth daily with breakfast.   Yes [provider]  rosuvastatin (CRESTOR) 20 MG tablet Take 20 mg by mouth at bedtime.   Yes [provider]  acetaminophen  (TYLENOL ) 500 MG tablet Take 1,000 mg by mouth every 6 (six) hours as needed for headache (pain).    [provider]  aspirin  EC 81 MG tablet Take 81 mg by mouth at bedtime.     [provider]  cetirizine (ZYRTEC) 10 MG tablet Take 10 mg by mouth daily.    [provider]  Cholecalciferol (VITAMIN D3) 3000 units TABS Take 3,000 Units by mouth daily.    [provider]  fluticasone  (FLONASE ) 50 MCG/ACT nasal spray Place 1 spray into both nostrils daily as needed (seasonal allergies).     [provider]  furosemide  (LASIX ) 20 MG tablet Take 1 tablet (20 mg total) by mouth daily. Patient taking differently: Take 20 mg by mouth daily as needed for fluid or edema. 08/07/15   Trixie File, MD  losartan  (COZAAR ) 100 MG tablet Take 1 tablet (100 mg total) by mouth daily. 03/02/16   Trixie File, MD  naproxen  (NAPROSYN ) 250 MG tablet Take 1 tablet (250 mg total) by mouth 2 (two) times daily as needed (for back pain). 12/16/16   Verdene Purchase, MD    Physical Exam:  Vitals:   06/02/24 1222 06/02/24 1247 06/02/24 1251 06/02/24 1434  BP:  (!) 147/83    Pulse:  (!) 104    Resp:  (!) 24    Temp:  97.8 F (36.6 C)    SpO2: 94% 93%  94%  Weight:   77 kg   Height:   5' 3 (1.6 m)    Wt Readings from Last 3 Encounters:  06/02/24 77 kg  12/14/16 77 kg  03/02/16 78.5 kg   Body mass index is 30.07 kg/m.  General:  Average built, not in  obvious distress elderly female, hard of hearing, on nasal cannula oxygen HENT: Normocephalic, No scleral pallor or icterus noted. Oral mucosa is moist.  Chest: Coarse breath sounds noted, diminished breath sounds bilaterally CVS: S1 &S2 heard. No murmur.  Regular rate and rhythm. Abdomen: Soft, nontender, nondistended.  Bowel sounds are heard. No abdominal mass palpated Extremities: No cyanosis, clubbing with trace peripheral edema peripheral pulses are palpable. Psych: Alert, awake and oriented, normal mood CNS:  No cranial nerve deficits.  Power equal in all extremities.   Skin: Warm and dry.  No rashes noted.  Labs on Admission:   CBC: Recent Labs  Lab  06/02/24 1305 06/02/24 1338  WBC 10.8*  --   NEUTROABS 8.3*  --   HGB 12.4 13.6  HCT 42.0 40.0  MCV 86.4  --   PLT 303  --     Basic Metabolic Panel: Recent Labs  Lab 06/02/24 1305 06/02/24 1338  NA 138 140  K 4.0 4.0  CL 103 103  CO2 22  --   GLUCOSE 160* 160*  BUN 16 16  CREATININE 0.89 1.00  CALCIUM  9.3  --     Liver Function Tests: Recent Labs  Lab 06/02/24 1305  AST 18  ALT 18  ALKPHOS 118  BILITOT 1.6*  PROT 6.7  ALBUMIN 4.1   Recent Labs  Lab 06/02/24 1305  LIPASE 12   No results for input(s): AMMONIA in the last 168 hours.  Cardiac Enzymes: No results for input(s): CKTOTAL, CKMB, CKMBINDEX, TROPONINI in the last 168 hours.  BNP (last 3 results) No results for input(s): BNP in the last 8760 hours.  ProBNP (last 3 results) Recent Labs    06/02/24 1305  PROBNP 268.0    CBG: No results for input(s): GLUCAP in the last 168 hours.  Lipase     Component Value Date/Time   LIPASE 12 06/02/2024 1305     Urinalysis    Component Value Date/Time   COLORURINE AMBER (A) 12/14/2016 2021   APPEARANCEUR CLOUDY (A) 12/14/2016 2021   LABSPEC 1.020 12/14/2016 2021   PHURINE 5.0 12/14/2016 2021   GLUCOSEU NEGATIVE 12/14/2016 2021   HGBUR MODERATE (A) 12/14/2016 2021    BILIRUBINUR NEGATIVE 12/14/2016 2021   KETONESUR 5 (A) 12/14/2016 2021   PROTEINUR 100 (A) 12/14/2016 2021   UROBILINOGEN 0.2 10/07/2012 1812   NITRITE POSITIVE (A) 12/14/2016 2021   LEUKOCYTESUR MODERATE (A) 12/14/2016 2021     Drugs of Abuse     Component Value Date/Time   LABOPIA NONE DETECTED 12/14/2016 2021   COCAINSCRNUR NONE DETECTED 12/14/2016 2021   LABBENZ NONE DETECTED 12/14/2016 2021   AMPHETMU NONE DETECTED 12/14/2016 2021   THCU NONE DETECTED 12/14/2016 2021   LABBARB NONE DETECTED 12/14/2016 2021      Radiological Exams on Admission: DG Chest Portable 1 View Result Date: 06/02/2024 CLINICAL DATA:  sob EXAM: PORTABLE CHEST - 1 VIEW COMPARISON:  December 14, 2016 FINDINGS: Bilateral perihilar interstitial opacities. Hazy airspace opacities in both lung bases with blunting of the costophrenic sulci. No pneumothorax. Moderate cardiomegaly. Tortuous aorta with aortic atherosclerosis. No acute fracture or destructive lesions. Multilevel thoracic osteophytosis. IMPRESSION: 1. Bilateral perihilar interstitial opacities, which may represent atypical/viral infection or interstitial, in the correct clinical context. 2. Hazy airspace opacities in both lung bases with blunting of the costophrenic sulci, likely small layering pleural effusions with bibasilar atelectasis. Alternatively, superimposed bronchopneumonia or aspiration could have this appearance. Electronically Signed   By: Rogelia Myers M.D.   On: 06/02/2024 14:29    EKG: Personally reviewed by me which shows sinus tachycardia   Consultant: None  Code Status: Full code  Microbiology: blood culture  Antibiotics: Rocephin  and Zithromax  Family Communication:  Patients' condition and plan of care including tests being ordered have been discussed with the patient and patient daughter who indicate understanding and agree with the plan.   Status is: Inpatient   Severity of Illness: The appropriate patient status for  this patient is INPATIENT. Inpatient status is judged to be reasonable and necessary in order to provide the required intensity of service to ensure the patient's safety. The patient's presenting symptoms,  physical exam findings, and initial radiographic and laboratory data in the context of their chronic comorbidities is felt to place them at high risk for further clinical deterioration. Furthermore, it is not anticipated that the patient will be medically stable for discharge from the hospital within 2 midnights of admission.   * I certify that at the point of admission it is my clinical judgment that the patient will require inpatient hospital care spanning beyond 2 midnights from the point of admission due to high intensity of service, high risk for further deterioration and high frequency of surveillance required.*  Signed, Vernal Alstrom, MD [facility] 06/02/2024   Home

## 2024-06-02 NOTE — Progress Notes (Signed)
     Patient Name: Katherine Bennett           DOB: [birth year]  MRN: [id number]      Admission Date: 06/02/2024  Attending Provider: Sonjia Held, MD  Primary Diagnosis: Pneumonia   Level of care: Progressive   OVERNIGHT EVENT   HPI/ Events of Note  Katherine Bennett, 83 y.o. female, was admitted on 06/02/2024 for Pneumonia.  Notified by ***    Bedside Assessment:  Patient is awake, A/O x***, with no associated distress.  Hemodynamically ***stable --> Blood pressure (!) 145/72, pulse 97, temperature 97.8 F (36.6 C), temperature source Oral, resp. rate 20, height 5' 3 (1.6 m), weight 77 kg, SpO2 95%.   Denies dizziness, lightheadedness, CP, palpitations,  SOB, N/V, abdominal discomfort.  Endorses ***.    Respiratory: Bilaterally clear, no wheezing, no crackles. Normal effort. No accessory muscle use.  Cardiovascular: Regular rate and rhythm. No extremity edema. 2+ pedal pulses. No carotid bruits.  Abdomen: Abdomen is soft and nontender.  Positive bowel sounds in all quadrants.     Plan:    Addendum:    Latanza Pfefferkorn, DNP, ACNPC- AG [facility] [place]

## 2024-06-02 NOTE — ED Triage Notes (Signed)
 Pt BIB EMS from home c/o n/v/d for the past 4 days. SOB, non productive cough. Family is sick but has not tested positive for Covid or flu. Given 900 ml of LR, 4mg  ondansetron . Hx of hyperlipidemia, DM, HTN.   EMS vitals  BP 146/100 HR 102 SPO2 88 RA (98 w 2L, not normally on O2)  CBG 173 CO2 30

## 2024-06-02 NOTE — ED Notes (Signed)
 Blood cultures were sent before they were clicked off before antibiotics were started

## 2024-06-02 NOTE — Sepsis Progress Note (Signed)
 Elink will follow per sepsis protocol.

## 2024-06-02 NOTE — ED Triage Notes (Signed)
 Pt came in complaining of SOB. States she was around daughter who had an upper respiratory illness. Pt states it has been ongoing for about a week. Reports feeling generalized aches, coughing and N/D.

## 2024-06-02 NOTE — ED Notes (Signed)
 Unable to obtain 2nd IV. Therefore unable to obtain 2nd set of cultures before abx admin

## 2024-06-02 NOTE — Progress Notes (Signed)
 No BiPAP at this time- PT states she is nauseated - RN aware. BiPAP order has been changed to PRN. Placing PT on [facility] ([facility]) for CT- RN aware.

## 2024-06-02 NOTE — ED Notes (Signed)
 IV team cancelled peripheral iV order. Pt currently unable to finish 1st round of abx.

## 2024-06-02 NOTE — ED Provider Notes (Addendum)
 [place] [facility]ERGENCY DEPARTMENT AT [facility] Provider Note   CSN: [id number] Arrival date & time: 06/02/24  1210     Patient presents with: Nausea and Shortness of Breath   Katherine Bennett is a 83 y.o. female.   Patient here with shortness of breath last week body aches chills maybe fever.  Some nausea vomiting diarrhea.  She has a history of hypertension high cholesterol IBS.  Denies any chest pain.  She feels short of breath worse today specially with ambulation.  Oxygen was low at home and called EMS.  EMS placed her on several liters of oxygen.She has had negative viral testing at home.  She was given Zofran  and fluid bolus with EMS.  She denies heart failure history.  Denies any recent surgery or travel.  No blood clot history.  The history is provided by the patient.       Prior to Admission medications   Medication Sig Start Date End Date Taking? Authorizing Provider  acetaminophen  (TYLENOL ) 500 MG tablet Take 1,000 mg by mouth every 6 (six) hours as needed for headache (pain).    [provider]  aspirin  EC 81 MG tablet Take 81 mg by mouth at bedtime.     [provider]  cetirizine (ZYRTEC) 10 MG tablet Take 10 mg by mouth daily.    [provider]  Cholecalciferol (VITAMIN D3) 3000 units TABS Take 3,000 Units by mouth daily.    [provider]  fluticasone  (FLONASE ) 50 MCG/ACT nasal spray Place 1 spray into both nostrils daily as needed (seasonal allergies).     [provider]  furosemide  (LASIX ) 20 MG tablet Take 1 tablet (20 mg total) by mouth daily. 08/07/15   Trixie File, MD  losartan  (COZAAR ) 100 MG tablet Take 1 tablet (100 mg total) by mouth daily. 03/02/16   Trixie File, MD  naproxen  (NAPROSYN ) 250 MG tablet Take 1 tablet (250 mg total) by mouth 2 (two) times daily as needed (for back pain). 12/16/16   Krishnan, Gokul, MD  simvastatin (ZOCOR) 80 MG tablet Take 80 mg by mouth at bedtime.     [provider]    Allergies: Morphine and codeine    Review of Systems  Updated Vital Signs BP (!) 147/83   Pulse (!) 104   Temp 97.8 F (36.6 C)   Resp (!) 24   Ht 5' 3 (1.6 m)   Wt 77 kg   SpO2 94%   BMI 30.07 kg/m   Physical Exam Vitals and nursing note reviewed.  Constitutional:      General: She is not in acute distress.    Appearance: She is well-developed. She is not ill-appearing.  HENT:     Head: Normocephalic and atraumatic.     Mouth/Throat:     Mouth: Mucous membranes are moist.  Eyes:     Conjunctiva/sclera: Conjunctivae normal.     Pupils: Pupils are equal, round, and reactive to light.  Cardiovascular:     Rate and Rhythm: Regular rhythm. Tachycardia present.     Heart sounds: No murmur heard. Pulmonary:     Comments: Increased work of breathing, coarse breath sounds throughout Abdominal:     Palpations: Abdomen is soft.     Tenderness: There is no abdominal tenderness.  Musculoskeletal:        General: No swelling.     Cervical back: Normal range of motion and neck supple.     Right lower leg: Edema present.  Left lower leg: Edema present.     Comments: Trace edema both lower extremities but no obvious pitting edema  Skin:    General: Skin is warm and dry.     Capillary Refill: Capillary refill takes less than 2 seconds.  Neurological:     General: No focal deficit present.     Mental Status: She is alert.  Psychiatric:        Mood and Affect: Mood normal.     (all labs ordered are listed, but only abnormal results are displayed) Labs Reviewed  CBC WITH DIFFERENTIAL/PLATELET - Abnormal; Notable for the following components:      Result Value   WBC 10.8 (*)    MCH 25.5 (*)    MCHC 29.5 (*)    Neutro Abs 8.3 (*)    All other components within normal limits  COMPREHENSIVE METABOLIC PANEL WITH GFR - Abnormal; Notable for the following components:   Glucose, Bld 160 (*)    Total Bilirubin 1.6 (*)    All other components  within normal limits  I-STAT CHEM 8, ED - Abnormal; Notable for the following components:   Glucose, Bld 160 (*)    All other components within normal limits  RESP PANEL BY RT-PCR (RSV, FLU A&B, COVID)  [id number]  CULTURE, BLOOD (ROUTINE X 2)  CULTURE, BLOOD (ROUTINE X 2)  BLOOD GAS, VENOUS  LIPASE, BLOOD  PRO BRAIN NATRIURETIC PEPTIDE  I-STAT CG4 LACTIC ACID, ED  I-STAT CG4 LACTIC ACID, ED  TROPONIN T, HIGH SENSITIVITY  TROPONIN T, HIGH SENSITIVITY    EKG: EKG Interpretation Date/Time:  Friday June 02 2024 14:12:35 EDT Ventricular Rate:  106 PR Interval:  143 QRS Duration:  84 QT Interval:  329 QTC Calculation: 437 R Axis:   30  Text Interpretation: Sinus tachycardia Confirmed by Ruthe Cornet [phone]) on 06/02/2024 3:51:01 PM  Radiology: [facility] Chest Portable 1 View Result Date: 06/02/2024 CLINICAL DATA:  sob EXAM: PORTABLE CHEST - 1 VIEW COMPARISON:  December 14, 2016 FINDINGS: Bilateral perihilar interstitial opacities. Hazy airspace opacities in both lung bases with blunting of the costophrenic sulci. No pneumothorax. Moderate cardiomegaly. Tortuous aorta with aortic atherosclerosis. No acute fracture or destructive lesions. Multilevel thoracic osteophytosis. IMPRESSION: 1. Bilateral perihilar interstitial opacities, which may represent atypical/viral infection or interstitial, in the correct clinical context. 2. Hazy airspace opacities in both lung bases with blunting of the costophrenic sulci, likely small layering pleural effusions with bibasilar atelectasis. Alternatively, superimposed bronchopneumonia or aspiration could have this appearance. Electronically Signed   By: Rogelia Myers M.D.   On: 06/02/2024 14:29     .Critical Care  Performed by: Ruthe Cornet, DO Authorized by: Ruthe Cornet, DO   Critical care provider statement:    Critical care time (minutes):  45   Critical care was necessary to treat or prevent imminent or life-threatening deterioration of the  following conditions:  Respiratory failure and sepsis   Critical care was time spent personally by me on the following activities:  Blood draw for specimens, development of treatment plan with patient or surrogate, discussions with primary provider, evaluation of patient's response to treatment, examination of patient, obtaining history from patient or surrogate, ordering and performing treatments and interventions, ordering and review of laboratory studies, ordering and review of radiographic studies, pulse oximetry, re-evaluation of patient's condition and review of old charts   Care discussed with: admitting provider      Medications Ordered in the ED  azithromycin (ZITHROMAX) 500 mg in sodium chloride 0.9 %  250 mL IVPB (500 mg Intravenous New Bag/Given 06/02/24 1334)  cefTRIAXone  (ROCEPHIN ) 1 g in sodium chloride 0.9 % 100 mL IVPB (1 g Intravenous New Bag/Given 06/02/24 1325)  ondansetron  (ZOFRAN ) injection 4 mg (4 mg Intravenous Given 06/02/24 1533)  iohexol (OMNIPAQUE) 350 MG/ML injection 100 mL (100 mLs Intravenous Contrast Given 06/02/24 1458)                                    Medical Decision Making Amount and/or Complexity of Data Reviewed Labs: ordered. Radiology: ordered.  Risk Prescription drug management. Decision regarding hospitalization.   Aydee Mcnew Heeren arrives with shortness of breath and also nausea vomiting diarrhea.  She arrives hypoxic placed on 5 L of oxygen.  She does not normally wear oxygen.  Oxygen level was in the low 80s.  She is tachypneic mildly tachycardic but she does not have a fever here but may be fever at home here the last few days.  Took Tylenol  prior to coming here.  Maybe sick contacts at home.  Breathing is gotten worse over the last few days.  She states that she has had a dry cough.  She has had negative COVID and flu test at home.  Ultimately will pursue infectious workup but also cardiac workup with troponin BNP blood cultures lactic acid  chest x-ray basic labs.  Will hold on antibiotics at this time given no fever here.  But differential does include infectious process/sepsis/pneumonia/volume overload heart failure.  Possibly PE.  Overall chest x-ray consistent with bilateral perihilar interstitial opacities which look like a infectious process.  There looks to be bibasilar atelectasis and effusion.  Looks like a bronchopneumonia or aspiration.  Clinically I think this makes the most sense given that patient has reassuring EKG normal troponin and BNP.  Blood gas is actually reassuring as well and there is no significant leukocytosis anemia or electrolyte abnormality.  COVID flu RSV testing is negative.  Lactic acid is unremarkable.  Overall patient was started on broad-spectrum IV antibiotics given concern for possible pneumonia on chest x-ray. Hypoxia tachycardia will initiate sepsis workup.  CT scan of the chest abdomen and pelvis obtained to further evaluate but I do believe that this is likely pneumonia causing her symptoms.  Patient placed on high flow heated nasal cannula for further supportive care.  Will admit for further supportive care and respiratory failure care.  This chart was dictated using voice recognition software.  Despite best efforts to proofread,  errors can occur which can change the documentation meaning.      Final diagnoses:  Acute respiratory failure with hypoxia [facility])  Community acquired pneumonia of left lung, unspecified part of lung    ED Discharge Orders     None          Ruthe Cornet, DO 06/02/24 1508    Ruthe Cornet, DO 06/02/24 1551

## 2024-06-03 DIAGNOSIS — B342 Coronavirus infection, unspecified: Secondary | ICD-10-CM

## 2024-06-03 DIAGNOSIS — J9601 Acute respiratory failure with hypoxia: Secondary | ICD-10-CM

## 2024-06-03 LAB — BLOOD CULTURE ID PANEL (REFLEXED) - BCID2

## 2024-06-03 LAB — BASIC METABOLIC PANEL WITH GFR
Anion gap: 14 (ref 5–15)
BUN: 17 mg/dL (ref 8–23)
CO2: 22 mmol/L (ref 22–32)
Calcium: 9.3 mg/dL (ref 8.9–10.3)
Chloride: 103 mmol/L (ref 98–111)
Creatinine, Ser: 0.93 mg/dL (ref 0.44–1.00)
GFR, Estimated: 60 mL/min — ABNORMAL LOW (ref >60)
Glucose, Bld: 142 mg/dL — ABNORMAL HIGH (ref 70–99)
Potassium: 4.5 mmol/L (ref 3.5–5.1)
Sodium: 139 mmol/L (ref 135–145)

## 2024-06-03 LAB — RESPIRATORY PANEL BY PCR

## 2024-06-03 LAB — MAGNESIUM: Magnesium: 1.8 mg/dL (ref 1.7–2.4)

## 2024-06-03 LAB — CBC
HCT: 39.7 % (ref 36.0–46.0)
Hemoglobin: 11.9 g/dL — ABNORMAL LOW (ref 12.0–15.0)
MCH: 26.6 pg (ref 26.0–34.0)
MCHC: 30 g/dL (ref 30.0–36.0)
MCV: 88.8 fL (ref 80.0–100.0)
Platelets: 256 K/uL (ref 150–400)
RBC: 4.47 MIL/uL (ref 3.87–5.11)
RDW: 13.7 % (ref 11.5–15.5)
WBC: 8.5 K/uL (ref 4.0–10.5)
nRBC: 0 % (ref 0.0–0.2)

## 2024-06-03 MED ORDER — GUAIFENESIN-DM 100-10 MG/5ML PO SYRP
5.0000 mL | ORAL_SOLUTION | ORAL | Status: DC | PRN
  Administered 2024-06-03 – 2024-06-06 (×6): 5 mL via ORAL
  Filled 2024-06-03: qty 5
  Filled 2024-06-03: qty 10
  Filled 2024-06-03: qty 5
  Filled 2024-06-03: qty 10
  Filled 2024-06-03 (×2): qty 5

## 2024-06-03 NOTE — Progress Notes (Signed)
 Patient transported to Rm 1435 via bed. Patient on 10L [employer] during transport with continuous pulse ox in place. Patient safety maintained during transport. Patient's Daughter brought patient's belongings to Rm 1435. Once in the room, patient placed on  9L HFNC.

## 2024-06-03 NOTE — Progress Notes (Signed)
 Rapid Response Event Note   Reason for Call : pt oxygen requirements   Initial Focused Assessment: found pt to be A/O and able to f/c. Denies pain but anxious about her care.  See VS in flowsheet.  Pt Is currently on 9 L .  Pt lungs sounds are bilaterally rhonchi and decreased bases.  Pt daughter at the bedside ,also.  Pt is not in distress.     Interventions: VS intact, see new orders received and initiated.  TRIAD, NP at bedside to access pt.      Plan of Care: Pt will transfer to a higher level of care per TRIAD, NP.    Event Summary:   MD Notified: yes Call Time: 2223 Arrival Time: 2224 End Time: 2300  Nuri Branca Lavern, RN

## 2024-06-03 NOTE — Progress Notes (Signed)
 PHARMACY - PHYSICIAN COMMUNICATION CRITICAL VALUE ALERT - BLOOD CULTURE IDENTIFICATION (BCID)  Katherine Bennett is an 83 y.o. female who presented to [facility] on 06/02/2024 with a chief complaint of pneumonia  Assessment:  1/4 BCx bottles growing MSSE (suspect contam)  Name of physician (or Provider) Contacted: L[place]  Current antibiotics: none  Changes to prescribed antibiotics recommended: none Recommendations accepted by provider  Results for orders placed or performed during the hospital encounter of 06/02/24  Blood Culture ID Panel (Reflexed) (Collected: 06/02/2024  1:28 PM)  Result Value Ref Range   Enterococcus faecalis NOT DETECTED NOT DETECTED   Enterococcus Faecium NOT DETECTED NOT DETECTED   Listeria monocytogenes NOT DETECTED NOT DETECTED   Staphylococcus species DETECTED (A) NOT DETECTED   Staphylococcus aureus (BCID) NOT DETECTED NOT DETECTED   Staphylococcus epidermidis DETECTED (A) NOT DETECTED   Staphylococcus lugdunensis NOT DETECTED NOT DETECTED   Streptococcus species NOT DETECTED NOT DETECTED   Streptococcus agalactiae NOT DETECTED NOT DETECTED   Streptococcus pneumoniae NOT DETECTED NOT DETECTED   Streptococcus pyogenes NOT DETECTED NOT DETECTED   A.calcoaceticus-baumannii NOT DETECTED NOT DETECTED   Bacteroides fragilis NOT DETECTED NOT DETECTED   Enterobacterales NOT DETECTED NOT DETECTED   Enterobacter cloacae complex NOT DETECTED NOT DETECTED   Escherichia coli NOT DETECTED NOT DETECTED   Klebsiella aerogenes NOT DETECTED NOT DETECTED   Klebsiella oxytoca NOT DETECTED NOT DETECTED   Klebsiella pneumoniae NOT DETECTED NOT DETECTED   Proteus species NOT DETECTED NOT DETECTED   Salmonella species NOT DETECTED NOT DETECTED   Serratia marcescens NOT DETECTED NOT DETECTED   Haemophilus influenzae NOT DETECTED NOT DETECTED   Neisseria meningitidis NOT DETECTED NOT DETECTED   Pseudomonas aeruginosa NOT DETECTED NOT DETECTED   Stenotrophomonas  maltophilia NOT DETECTED NOT DETECTED   Candida albicans NOT DETECTED NOT DETECTED   Candida auris NOT DETECTED NOT DETECTED   Candida glabrata NOT DETECTED NOT DETECTED   Candida krusei NOT DETECTED NOT DETECTED   Candida parapsilosis NOT DETECTED NOT DETECTED   Candida tropicalis NOT DETECTED NOT DETECTED   Cryptococcus neoformans/gattii NOT DETECTED NOT DETECTED   Methicillin resistance mecA/C NOT DETECTED NOT DETECTED    [name] A 06/03/2024  1:01 PM

## 2024-06-03 NOTE — Progress Notes (Addendum)
   06/02/24 2200 06/02/24 2203  Assess: MEWS Score  SpO2 90 % 93 %  O2 Device HFNC HFNC  O2 Flow Rate (L/min) 8 L/min 9 L/min    RN entered the patient's room to administer scheduled medication. Patient's O2 sat 90% on 8L HFNC. RN increased O2 to 9L HFNC.  RN notified Lavanda Horns, NP via secure chat of the above information. Charge nurse is aware. RN notified Katherin, RN Rapid Response RN.   RN notified Amy, RT of the above and requested the patient receive a breathing treatment for this patient.    Rapid RN assessed patient at the bedside. RT administered a breathing treatment.   Lavanda Horns, NP assessed the patient at bedside.   Orders Received: Transfer patient to Progressive care d/t Oxygen requirement.

## 2024-06-03 NOTE — Progress Notes (Signed)
 PROGRESS NOTE    Katherine Bennett  FMW:[id number] DOB: [birth year] DOA: 06/02/2024 PCP: Joneen Nest, MD   Brief Narrative:  Patient is a 83 years old female with past medical history of hyperlipidemia hypertension irritable bowel syndrome obesity and osteoporosis presented to hospital with worsening shortness of breath and cough for a week.  Patient tested positive for coronavirus NL 63 at intake, hospitalist called for admission.  Assessment & Plan:   Principal Problem:   Pneumonia   Acute hypoxic respiratory failure secondary to coronavirus infection, POA Bilateral pneumonia, likely viral - Coronavirus subtype NL63 positive - COVID, flu, RSV negative - Patient is not on oxygen at baseline, currently requiring upwards of 9 to 10 L nasal cannula to maintain sats above 90% - Imaging notable for bilateral atelectasis versus infiltrate, with mild to moderate effusion bilaterally - Continue supportive care, antibiotics discontinued, cultures continue to pend but unlikely concurrent viral and bacterial pneumonia - Blood cultures positive x 1, concern for contaminant given staph species, follow repeat labs - Imaging negative for pulmonary embolism  History of hyperlipidemia - hold statin   Hypertension -continue home amlodipine and losartan     Diabetes mellitus type 2.  A1c of 6.8 from recent lab work Obesity discussed dietary and lifestyle changes  DVT prophylaxis: enoxaparin  (LOVENOX ) injection 40 mg Start: 06/02/24 2200 Code Status:   Code Status: Full Code Family Communication: At bedside  Status is: Inpatient  Dispo: The patient is from: Home              Anticipated d/c is to: Home              Anticipated d/c date is: 24 to 48 hours              Patient currently not medically stable for discharge given profound hypoxia from baseline  Consultants:  None  Procedures:  None  Antimicrobials:  None indicated  Subjective: No acute issues or events  overnight denies nausea vomiting diarrhea constipation headache fevers chills or chest pain.  Shortness of breath ongoing but markedly improved with supplemental oxygen this morning.  Objective: Vitals:   06/02/24 2223 06/02/24 2233 06/03/24 0017 06/03/24 0408  BP:   (!) 176/100 (!) 176/82  Pulse:   (!) 107 (!) 104  Resp:   19 19  Temp:   98 F (36.7 C) (!) 97.5 F (36.4 C)  TempSrc:   Oral Oral  SpO2: 94% 95% 97% 98%  Weight:      Height:        Intake/Output Summary (Last 24 hours) at 06/03/2024 1430 Last data filed at 06/02/2024 2216 Gross per 24 hour  Intake 353 ml  Output --  Net 353 ml   Filed Weights   06/02/24 1251  Weight: 77 kg    Examination:  General:  Pleasantly resting in bed, No acute distress. HEENT:  Normocephalic atraumatic.  Sclerae nonicteric, noninjected.  Extraocular movements intact bilaterally. Neck:  Without mass or deformity.  Trachea is midline. Lungs:  Clear to auscultate bilaterally without rhonchi, wheeze, or rales. Heart:  Regular rate and rhythm.  Without murmurs, rubs, or gallops. Abdomen:  Soft, nontender, nondistended.  Without guarding or rebound. Extremities: Without cyanosis, clubbing, edema, or obvious deformity. Skin:  Warm and dry, no erythema.  Data Reviewed: I have personally reviewed following labs and imaging studies  CBC: Recent Labs  Lab 06/02/24 1305 06/02/24 1338 06/03/24 0438  WBC 10.8*  --  8.5  NEUTROABS 8.3*  --   --  HGB 12.4 13.6 11.9*  HCT 42.0 40.0 39.7  MCV 86.4  --  88.8  PLT 303  --  256   Basic Metabolic Panel: Recent Labs  Lab 06/02/24 1305 06/02/24 1338 06/03/24 0438  NA 138 140 139  K 4.0 4.0 4.5  CL 103 103 103  CO2 22  --  22  GLUCOSE 160* 160* 142*  BUN 16 16 17   CREATININE 0.89 1.00 0.93  CALCIUM  9.3  --  9.3  MG  --   --  1.8   GFR: Estimated Creatinine Clearance: 43.4 mL/min (by C-G formula based on SCr of 0.93 mg/dL). Liver Function Tests: Recent Labs  Lab 06/02/24 1305   AST 18  ALT 18  ALKPHOS 118  BILITOT 1.6*  PROT 6.7  ALBUMIN 4.1   Recent Labs  Lab 06/02/24 1305  LIPASE 12   Cardiac Enzymes: Recent Labs  Lab 06/02/24 2051  CKTOTAL 63   BNP (last 3 results) Recent Labs    06/02/24 1305  PROBNP 268.0   Sepsis Labs: Recent Labs  Lab 06/02/24 1337 06/02/24 1639  LATICACIDVEN 1.2 1.7    Recent Results (from the past 240 hours)  Resp panel by RT-PCR (RSV, Flu A&B, Covid) Anterior Nasal Swab     Status: None   Collection Time: 06/02/24  1:05 PM   Specimen: Anterior Nasal Swab  Result Value Ref Range Status   SARS Coronavirus 2 by RT PCR NEGATIVE NEGATIVE Final    Comment: (NOTE) SARS-CoV-2 target nucleic acids are NOT DETECTED.  The SARS-CoV-2 RNA is generally detectable in upper respiratory specimens during the acute phase of infection. The lowest concentration of SARS-CoV-2 viral copies this assay can detect is 138 copies/mL. A negative result does not preclude SARS-Cov-2 infection and should not be used as the sole basis for treatment or other patient management decisions. A negative result may occur with  improper specimen collection/handling, submission of specimen other than nasopharyngeal swab, presence of viral mutation(s) within the areas targeted by this assay, and inadequate number of viral copies(<138 copies/mL). A negative result must be combined with clinical observations, patient history, and epidemiological information. The expected result is Negative.  Fact Sheet for Patients:  BloggerCourse.com  Fact Sheet for Healthcare Providers:  SeriousBroker.it  This test is no t yet approved or cleared by the [place]  FDA and  has been authorized for detection and/or diagnosis of SARS-CoV-2 by FDA under an Emergency Use Authorization (EUA). This EUA will remain  in effect (meaning this test can be used) for the duration of the COVID-19 declaration under  Section 564(b)(1) of the Act, 21 U.S.C.section 360bbb-3(b)(1), unless the authorization is terminated  or revoked sooner.       Influenza A by PCR NEGATIVE NEGATIVE Final   Influenza B by PCR NEGATIVE NEGATIVE Final    Comment: (NOTE) The Xpert Xpress SARS-CoV-2/FLU/RSV plus assay is intended as an aid in the diagnosis of influenza from Nasopharyngeal swab specimens and should not be used as a sole basis for treatment. Nasal washings and aspirates are unacceptable for Xpert Xpress SARS-CoV-2/FLU/RSV testing.  Fact Sheet for Patients: BloggerCourse.com  Fact Sheet for Healthcare Providers: SeriousBroker.it  This test is not yet approved or cleared by the [place]  FDA and has been authorized for detection and/or diagnosis of SARS-CoV-2 by FDA under an Emergency Use Authorization (EUA). This EUA will remain in effect (meaning this test can be used) for the duration of the COVID-19 declaration under Section 564(b)(1) of the Act, 21  U.S.C. section 360bbb-3(b)(1), unless the authorization is terminated or revoked.     Resp Syncytial Virus by PCR NEGATIVE NEGATIVE Final    Comment: (NOTE) Fact Sheet for Patients: BloggerCourse.com  Fact Sheet for Healthcare Providers: SeriousBroker.it  This test is not yet approved or cleared by the [place]  FDA and has been authorized for detection and/or diagnosis of SARS-CoV-2 by FDA under an Emergency Use Authorization (EUA). This EUA will remain in effect (meaning this test can be used) for the duration of the COVID-19 declaration under Section 564(b)(1) of the Act, 21 U.S.C. section 360bbb-3(b)(1), unless the authorization is terminated or revoked.  Performed at [facility], [address] [place], [place] [address]   Respiratory (~20 pathogens) panel by PCR     Status: Abnormal   Collection Time: 06/02/24   1:05 PM   Specimen: Nasopharyngeal Swab; Respiratory  Result Value Ref Range Status   Adenovirus NOT DETECTED NOT DETECTED Final   Coronavirus 229E NOT DETECTED NOT DETECTED Final    Comment: (NOTE) The Coronavirus on the Respiratory Panel, DOES NOT test for the novel  Coronavirus (2019 nCoV)    Coronavirus HKU1 NOT DETECTED NOT DETECTED Final   Coronavirus NL63 DETECTED (A) NOT DETECTED Final   Coronavirus OC43 NOT DETECTED NOT DETECTED Final   Metapneumovirus NOT DETECTED NOT DETECTED Final   Rhinovirus / Enterovirus NOT DETECTED NOT DETECTED Final   Influenza A NOT DETECTED NOT DETECTED Final   Influenza B NOT DETECTED NOT DETECTED Final   Parainfluenza Virus 1 NOT DETECTED NOT DETECTED Final   Parainfluenza Virus 2 NOT DETECTED NOT DETECTED Final   Parainfluenza Virus 3 NOT DETECTED NOT DETECTED Final   Parainfluenza Virus 4 NOT DETECTED NOT DETECTED Final   Respiratory Syncytial Virus NOT DETECTED NOT DETECTED Final   Bordetella pertussis NOT DETECTED NOT DETECTED Final   Bordetella Parapertussis NOT DETECTED NOT DETECTED Final   Chlamydophila pneumoniae NOT DETECTED NOT DETECTED Final   Mycoplasma pneumoniae NOT DETECTED NOT DETECTED Final    Comment: Performed at [facility], [address] [place], [place] [address]  Blood culture (routine x 2)     Status: None (Preliminary result)   Collection Time: 06/02/24  1:28 PM   Specimen: BLOOD  Result Value Ref Range Status   Specimen Description   Final    BLOOD LEFT ANTECUBITAL Performed at [facility], [address] [place], [place] [address]    Special Requests   Final    BOTTLES DRAWN AEROBIC AND ANAEROBIC Blood Culture results may not be optimal due to an inadequate volume of blood received in culture bottles Performed at [facility], [address], [place], [place] [address]    Culture  Setup Time   Final    GRAM POSITIVE COCCI ANAEROBIC BOTTLE ONLY CRITICAL RESULT  CALLED TO, READ BACK BY AND VERIFIED WITH: PHARMD Bard Jeans on [phone] @1255  by SM Performed at [facility], [address] [place], [place] [address]    Culture GRAM POSITIVE COCCI  Final   Report Status PENDING  Incomplete  Blood Culture ID Panel (Reflexed)     Status: Abnormal   Collection Time: 06/02/24  1:28 PM  Result Value Ref Range Status   Enterococcus faecalis NOT DETECTED NOT DETECTED Final   Enterococcus Faecium NOT DETECTED NOT DETECTED Final   Listeria monocytogenes NOT DETECTED NOT DETECTED Final   Staphylococcus species DETECTED (A) NOT DETECTED Final    Comment: CRITICAL  RESULT CALLED TO, READ BACK BY AND VERIFIED WITH: PHARMD Bard Jeans on [phone] @1255  by SM    Staphylococcus aureus (BCID) NOT DETECTED NOT DETECTED Final   Staphylococcus epidermidis DETECTED (A) NOT DETECTED Final    Comment: CRITICAL RESULT CALLED TO, READ BACK BY AND VERIFIED WITH: PHARMD Bard Jeans on [phone] @1255  by SM    Staphylococcus lugdunensis NOT DETECTED NOT DETECTED Final   Streptococcus species NOT DETECTED NOT DETECTED Final   Streptococcus agalactiae NOT DETECTED NOT DETECTED Final   Streptococcus pneumoniae NOT DETECTED NOT DETECTED Final   Streptococcus pyogenes NOT DETECTED NOT DETECTED Final   A.calcoaceticus-baumannii NOT DETECTED NOT DETECTED Final   Bacteroides fragilis NOT DETECTED NOT DETECTED Final   Enterobacterales NOT DETECTED NOT DETECTED Final   Enterobacter cloacae complex NOT DETECTED NOT DETECTED Final   Escherichia coli NOT DETECTED NOT DETECTED Final   Klebsiella aerogenes NOT DETECTED NOT DETECTED Final   Klebsiella oxytoca NOT DETECTED NOT DETECTED Final   Klebsiella pneumoniae NOT DETECTED NOT DETECTED Final   Proteus species NOT DETECTED NOT DETECTED Final   Salmonella species NOT DETECTED NOT DETECTED Final   Serratia marcescens NOT DETECTED NOT DETECTED Final   Haemophilus influenzae NOT DETECTED NOT DETECTED Final   Neisseria meningitidis  NOT DETECTED NOT DETECTED Final   Pseudomonas aeruginosa NOT DETECTED NOT DETECTED Final   Stenotrophomonas maltophilia NOT DETECTED NOT DETECTED Final   Candida albicans NOT DETECTED NOT DETECTED Final   Candida auris NOT DETECTED NOT DETECTED Final   Candida glabrata NOT DETECTED NOT DETECTED Final   Candida krusei NOT DETECTED NOT DETECTED Final   Candida parapsilosis NOT DETECTED NOT DETECTED Final   Candida tropicalis NOT DETECTED NOT DETECTED Final   Cryptococcus neoformans/gattii NOT DETECTED NOT DETECTED Final   Methicillin resistance mecA/C NOT DETECTED NOT DETECTED Final    Comment: Performed at [facility], [address], [place], [place] [address]  Blood culture (routine x 2)     Status: None (Preliminary result)   Collection Time: 06/02/24  8:51 PM   Specimen: BLOOD RIGHT ARM  Result Value Ref Range Status   Specimen Description   Final    BLOOD RIGHT ARM Performed at [facility], [address], [place], [place] [address]    Special Requests   Final    BOTTLES DRAWN AEROBIC AND ANAEROBIC Blood Culture adequate volume Performed at [facility], [address], [place], [place] [address]    Culture   Final    NO GROWTH < 12 HOURS Performed at [facility], [address], [place], [place] [address]    Report Status PENDING  Incomplete         Radiology Studies: CT Angio Chest PE W and/or Wo Contrast Result Date: 06/02/2024 EXAM: CTA CHEST 06/02/2024 03:26:50 PM TECHNIQUE: CTA of the chest was performed after the administration of 100 mL of iohexol (OMNIPAQUE) 350 MG/ML injection. Multiplanar reformatted images are provided for review. MIP images are provided for review. Automated exposure control, iterative reconstruction, and/or weight based adjustment of the mA/kV was utilized to reduce the radiation dose to as low as reasonably achievable. COMPARISON: Chest radiograph of earlier today. CLINICAL HISTORY: Pulmonary embolism  (PE) suspected, high prob. Per triage note: Pt came in complaining of SOB. States she was around daughter who had an upper respiratory illness. Pt states it has been ongoing for about a week. Reports feeling generalized aches, coughing and N/D. FINDINGS: PULMONARY ARTERIES: Pulmonary arteries  are adequately opacified for evaluation. The quality of evaluation for pulmonary embolism is good. No evidence of pulmonary embolism. Main pulmonary artery is normal in caliber. MEDIASTINUM: Normal heart size with moderate pericardial effusion. This may be complex, measuring greater than fluid density including on image 94/02. LAD and right coronary artery calcification. Atherosclerosis. There is no acute abnormality of the thoracic aorta. LYMPH NODES: No mediastinal, hilar or axillary lymphadenopathy. LUNGS AND PLEURA: Small left and small to moderate right pleural effusions. No loculation. Bibasilar collapse/consolidation. No pneumothorax. UPPER ABDOMEN: Limited images of the upper abdomen demonstrate cholecystectomy. Otherwise, the visualized portions are unremarkable. 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 No acute bone or soft tissue abnormality. IMPRESSION: 1. No evidence of pulmonary embolism. 2. Moderate pericardial effusion, possibly complex. Consider further evaluation with echocardiography. 3. Small left and small to moderate right pleural effusions without loculation. Bibasilar collapse/consolidative change is favored to represent compressive atelectasis . cannot exclude a component of concurrent infection or aspiration. Electronically signed by: Rockey Kilts MD 06/02/2024 04:26 PM EDT RP Workstation: [id number]   CT ABDOMEN PELVIS W CONTRAST Result Date: 06/02/2024 CLINICAL DATA:  Acute abdominal pain.  Nausea.  Diarrhea. EXAM: CT ABDOMEN AND PELVIS WITH CONTRAST TECHNIQUE: Multidetector CT imaging of the abdomen and pelvis was performed using the standard protocol  following bolus administration of intravenous contrast. RADIATION DOSE REDUCTION: This exam was performed according to the departmental dose-optimization program which includes automated exposure control, adjustment of the mA and/or kV according to patient size and/or use of iterative reconstruction technique. CONTRAST:  100mL OMNIPAQUE IOHEXOL 350 MG/ML SOLN COMPARISON:  None Available. FINDINGS: Lower Chest: Large pericardial effusion. Small to moderate bilateral pleural effusions and bibasilar atelectasis. Hepatobiliary: A few tiny sub-cm hepatic cysts are noted. No suspicious hepatic masses identified. Prior cholecystectomy. No evidence of biliary obstruction. Pancreas:  No mass or inflammatory changes. Spleen: Within normal limits in size and appearance. Adrenals/Urinary Tract: No suspicious masses identified. No evidence of ureteral calculi or hydronephrosis. Stomach/Bowel: No evidence of obstruction, inflammatory process or abnormal fluid collections. Vascular/Lymphatic: No pathologically enlarged lymph nodes. No acute vascular findings. Reproductive: Prior hysterectomy. Benign-appearing right ovarian cyst measuring 4.3 by 2.7 x 2.2 cm. Benign-appearing left ovarian cyst measuring 5.8 by 3.6 x 3.1 cm. Tiny amount of free pelvic fluid noted. Other: Tiny fat-containing umbilical hernia. Tiny fat-containing left inguinal hernia. Musculoskeletal:  No suspicious bone lesions identified. IMPRESSION: Large pericardial effusion. Small to moderate bilateral pleural effusions and bibasilar atelectasis. Bilateral benign-appearing ovarian cysts, measuring 5.8 cm on the left, and 4.3 cm on the right. Recommend follow-up US  in 6-12 months. Reference: JACR 2020 Feb; 17(2):248-254 Tiny amount of free pelvic fluid. Tiny fat-containing umbilical and left inguinal hernias. Electronically Signed   By: Norleen DELENA Kil M.D.   On: 06/02/2024 16:24   DG Chest Portable 1 View Result Date: 06/02/2024 CLINICAL DATA:  sob EXAM:  PORTABLE CHEST - 1 VIEW COMPARISON:  December 14, 2016 FINDINGS: Bilateral perihilar interstitial opacities. Hazy airspace opacities in both lung bases with blunting of the costophrenic sulci. No pneumothorax. Moderate cardiomegaly. Tortuous aorta with aortic atherosclerosis. No acute fracture or destructive lesions. Multilevel thoracic osteophytosis. IMPRESSION: 1. Bilateral perihilar interstitial opacities, which may represent atypical/viral infection or interstitial, in the correct clinical context. 2. Hazy airspace opacities in both lung bases with blunting of the costophrenic sulci, likely small layering pleural effusions with bibasilar atelectasis. Alternatively, superimposed bronchopneumonia or aspiration could have this appearance. Electronically Signed   By: Rogelia Myers  M.D.   On: 06/02/2024 14:29        Scheduled Meds:  amLODipine  5 mg Oral Daily   aspirin  EC  81 mg Oral Daily   cholecalciferol  3,000 Units Oral Daily   cinacalcet  60 mg Oral QHS   enoxaparin  (LOVENOX ) injection  40 mg Subcutaneous QHS   sodium chloride flush  3 mL Intravenous Q12H   Continuous Infusions:  sodium chloride       LOS: 1 day   Time spent:  Elsie JAYSON Montclair, DO [facility]  If 7PM-7AM, please contact night-coverage [email or web address]  06/03/2024, 2:30 PM

## 2024-06-03 NOTE — Progress Notes (Signed)
 Bipap order is PRN.  Patient is currently on [place] HFNC at 9L.  Patient has been having some nausea.  Bipap not needed at this time.

## 2024-06-03 NOTE — Evaluation (Signed)
 Physical Therapy Evaluation Patient Details Name: Katherine Bennett MRN: [id number] DOB: Dec 30, 1940 Today's Date: 06/03/2024  History of Present Illness  Pt admitted from home with PNA and with hx of Erbs Palsy, IBS, obesity, HOH, and osteoporosis  Clinical Impression  Pt admitted as above and presenting with functional mobility limitations 2* decreased activity tolerance and mild ambulatory balance deficits.  This date, pt up to ambulate 250' with O2 @ 8L and maintained SaO2 at 89 - 92%.  Attempted to decrease O2 but pt immediately reports increased WOB/SOB.  Pt up to bathroom on arrival to room with RN aware.      If plan is discharge home, recommend the following: A little help with walking and/or transfers;A little help with bathing/dressing/bathroom;Assistance with cooking/housework;Help with stairs or ramp for entrance   Can travel by private vehicle        Equipment Recommendations None recommended by PT  Recommendations for Other Services       Functional Status Assessment Patient has had a recent decline in their functional status and demonstrates the ability to make significant improvements in function in a reasonable and predictable amount of time.     Precautions / Restrictions Precautions Precautions: Fall;Other (comment) Precaution/Restrictions Comments: Monitor O2 Restrictions Weight Bearing Restrictions Per Provider Order: No      Mobility  Bed Mobility Overal bed mobility: Modified Independent             General bed mobility comments: No physical assist    Transfers Overall transfer level: Needs assistance Equipment used: None Transfers: Sit to/from Stand Sit to Stand: Contact guard assist           General transfer comment: steady assist only    Ambulation/Gait Ambulation/Gait assistance: Contact guard assist Gait Distance (Feet): 250 Feet (and 15' into bathroom) Assistive device: Rolling walker (2 wheels) Gait Pattern/deviations:  Step-through pattern, Decreased step length - right, Decreased step length - left, Shuffle Gait velocity: mod pace     General Gait Details: cues for posture and position from [employer]            Wheelchair Mobility     Tilt Bed    Modified Rankin (Stroke Patients Only)       Balance Overall balance assessment: Needs assistance Sitting-balance support: No upper extremity supported, Feet supported Sitting balance-Leahy Scale: Good     Standing balance support: No upper extremity supported Standing balance-Leahy Scale: Fair                               Pertinent Vitals/Pain Pain Assessment Pain Assessment: No/denies pain    Home Living Family/patient expects to be discharged to:: Private residence Living Arrangements: Alone Available Help at Discharge: Family;Available 24 hours/day Type of Home: House Home Access: Stairs to enter Entrance Stairs-Rails: None Entrance Stairs-Number of Steps: 2 Alternate Level Stairs-Number of Steps: 16 Home Layout: Two level;Bed/bath upstairs Home Equipment: Agricultural consultant (2 wheels);Cane - single point      Prior Function Prior Level of Function : Independent/Modified Independent                     Extremity/Trunk Assessment   Upper Extremity Assessment Upper Extremity Assessment: Overall WFL for tasks assessed    Lower Extremity Assessment Lower Extremity Assessment: Overall WFL for tasks assessed       Communication   Communication Communication: Impaired Factors Affecting Communication: Hearing impaired    Cognition  Arousal: Alert Behavior During Therapy: WFL for tasks assessed/performed, Impulsive   PT - Cognitive impairments: No apparent impairments                         Following commands: Intact       Cueing       General Comments      Exercises     Assessment/Plan    PT Assessment Patient needs continued PT services  PT Problem List Decreased activity  tolerance;Decreased balance;Decreased mobility       PT Treatment Interventions DME instruction;Gait training;Stair training;Functional mobility training;Balance training;Therapeutic exercise;Therapeutic activities;Patient/family education    PT Goals (Current goals can be found in the Care Plan section)  Acute Rehab PT Goals Patient Stated Goal: Regain IND and get off the oxygen PT Goal Formulation: With patient Time For Goal Achievement: 06/16/24 Potential to Achieve Goals: Good    Frequency Min 3X/week     Co-evaluation               AM-PAC PT 6 Clicks Mobility  Outcome Measure Help needed turning from your back to your side while in a flat bed without using bedrails?: None Help needed moving from lying on your back to sitting on the side of a flat bed without using bedrails?: None Help needed moving to and from a bed to a chair (including a wheelchair)?: A Little Help needed standing up from a chair using your arms (e.g., wheelchair or bedside chair)?: A Little Help needed to walk in hospital room?: A Little Help needed climbing 3-5 steps with a railing? : A Lot 6 Click Score: 19    End of Session Equipment Utilized During Treatment: Gait belt;Oxygen Activity Tolerance: Patient tolerated treatment well Patient left: with call bell/phone within reach;with family/visitor present;Other (comment) (bathroom) Nurse Communication: Mobility status PT Visit Diagnosis: Unsteadiness on feet (R26.81);Difficulty in walking, not elsewhere classified (R26.2)    Time: 8654-8586 PT Time Calculation (min) (ACUTE ONLY): 28 min   Charges:   PT Evaluation $PT Eval Low Complexity: 1 Low PT Treatments $Gait Training: 8-22 mins PT General Charges $$ ACUTE PT VISIT: 1 Visit         [facility] PT Acute Rehabilitation Services Office [phone]   Valory Wetherby 06/03/2024, 2:49 PM

## 2024-06-03 NOTE — Progress Notes (Signed)
 Patient arrived to RM 1504 from ED. Patient A&Ox4, endorses slight SOB, denies chest pain or palpitations. Patient on 8L HFNC O2 sat 97% when she arrived to the unit. Patient voided on arrival to the unit, walked with standby assist to [facility] with ED transport.   RT at bedside, O2 decreased to 7L [place]. Goal to wean O2 keep O2 sat >92% per the order.

## 2024-06-04 ENCOUNTER — Encounter (HOSPITAL_COMMUNITY): Payer: Self-pay | Admitting: Cardiology

## 2024-06-04 ENCOUNTER — Encounter (HOSPITAL_COMMUNITY)
Admission: EM | Disposition: A | Discharge status: Home Health | Payer: Self-pay | Source: Home / Self Care | Attending: Internal Medicine

## 2024-06-04 ENCOUNTER — Inpatient Hospital Stay (HOSPITAL_COMMUNITY)

## 2024-06-04 ENCOUNTER — Encounter (HOSPITAL_COMMUNITY): Payer: Self-pay

## 2024-06-04 DIAGNOSIS — U071 COVID-19: Secondary | ICD-10-CM

## 2024-06-04 DIAGNOSIS — I10 Essential (primary) hypertension: Secondary | ICD-10-CM

## 2024-06-04 DIAGNOSIS — I480 Paroxysmal atrial fibrillation: Secondary | ICD-10-CM | POA: Diagnosis not present

## 2024-06-04 DIAGNOSIS — I3139 Other pericardial effusion (noninflammatory): Secondary | ICD-10-CM | POA: Diagnosis not present

## 2024-06-04 DIAGNOSIS — E785 Hyperlipidemia, unspecified: Secondary | ICD-10-CM

## 2024-06-04 DIAGNOSIS — I314 Cardiac tamponade: Secondary | ICD-10-CM | POA: Diagnosis not present

## 2024-06-04 DIAGNOSIS — B342 Coronavirus infection, unspecified: Secondary | ICD-10-CM | POA: Diagnosis not present

## 2024-06-04 DIAGNOSIS — J9601 Acute respiratory failure with hypoxia: Principal | ICD-10-CM

## 2024-06-04 DIAGNOSIS — N179 Acute kidney failure, unspecified: Secondary | ICD-10-CM

## 2024-06-04 DIAGNOSIS — J90 Pleural effusion, not elsewhere classified: Secondary | ICD-10-CM

## 2024-06-04 HISTORY — PX: PERICARDIOCENTESIS: CATH118255

## 2024-06-04 LAB — CBC
HCT: 38.7 % (ref 36.0–46.0)
Hemoglobin: 11.5 g/dL — ABNORMAL LOW (ref 12.0–15.0)
MCH: 25.9 pg — ABNORMAL LOW (ref 26.0–34.0)
MCHC: 29.7 g/dL — ABNORMAL LOW (ref 30.0–36.0)
MCV: 87.2 fL (ref 80.0–100.0)
Platelets: 272 K/uL (ref 150–400)
RBC: 4.44 MIL/uL (ref 3.87–5.11)
RDW: 13.8 % (ref 11.5–15.5)
WBC: 7.6 K/uL (ref 4.0–10.5)
nRBC: 0 % (ref 0.0–0.2)

## 2024-06-04 LAB — GLUCOSE, PLEURAL OR PERITONEAL FLUID: Glucose, Fluid: 125 mg/dL

## 2024-06-04 LAB — BASIC METABOLIC PANEL WITH GFR
Anion gap: 14 (ref 5–15)
BUN: 23 mg/dL (ref 8–23)
CO2: 22 mmol/L (ref 22–32)
Calcium: 9.5 mg/dL (ref 8.9–10.3)
Chloride: 101 mmol/L (ref 98–111)
Creatinine, Ser: 1.24 mg/dL — ABNORMAL HIGH (ref 0.44–1.00)
GFR, Estimated: 42 mL/min — ABNORMAL LOW (ref >60)
Glucose, Bld: 143 mg/dL — ABNORMAL HIGH (ref 70–99)
Potassium: 4.4 mmol/L (ref 3.5–5.1)
Sodium: 137 mmol/L (ref 135–145)

## 2024-06-04 LAB — ECHOCARDIOGRAM COMPLETE
Area-P 1/2: 8.43 cm2
Height: 63 in
Weight: 3054.69 [oz_av]

## 2024-06-04 LAB — ALBUMIN: Albumin: 3 g/dL — ABNORMAL LOW (ref 3.5–5.0)

## 2024-06-04 LAB — BODY FLUID CELL COUNT WITH DIFFERENTIAL
Eos, Fluid: 0 %
Lymphs, Fluid: 63 %
Monocyte-Macrophage-Serous Fluid: 7 % — ABNORMAL LOW (ref 50–90)
Neutrophil Count, Fluid: 30 % — ABNORMAL HIGH (ref 0–25)
Total Nucleated Cell Count, Fluid: 3500 uL — ABNORMAL HIGH (ref 0–1000)

## 2024-06-04 LAB — ALBUMIN, PLEURAL OR PERITONEAL FLUID: Albumin, Fluid: 3.1 g/dL

## 2024-06-04 LAB — LACTATE DEHYDROGENASE, PLEURAL OR PERITONEAL FLUID: LD, Fluid: 244 U/L — ABNORMAL HIGH (ref 3–23)

## 2024-06-04 LAB — PROTEIN, PLEURAL OR PERITONEAL FLUID: Total protein, fluid: 5.1 g/dL

## 2024-06-04 SURGERY — PERICARDIOCENTESIS
Anesthesia: LOCAL

## 2024-06-04 MED ORDER — FREE WATER: 500.0000 mL | Freq: Once | Status: DC

## 2024-06-04 MED ORDER — LIDOCAINE HCL (PF) 1 % IJ SOLN
INTRAMUSCULAR | Status: DC | PRN
  Administered 2024-06-04: 10 mL

## 2024-06-04 MED ORDER — COLCHICINE 0.6 MG PO TABS
0.6000 mg | ORAL_TABLET | Freq: Two times a day (BID) | ORAL | Status: DC
  Administered 2024-06-04 – 2024-06-08 (×9): 0.6 mg via ORAL
  Filled 2024-06-04 (×10): qty 1

## 2024-06-04 MED ORDER — MIDAZOLAM HCL (PF) 2 MG/2ML IJ SOLN
INTRAMUSCULAR | Status: DC | PRN
  Administered 2024-06-04: 1 mg via INTRAVENOUS

## 2024-06-04 MED ORDER — CHLORHEXIDINE GLUCONATE CLOTH 2 % EX PADS
6.0000 | MEDICATED_PAD | Freq: Every day | CUTANEOUS | Status: DC
  Administered 2024-06-04 – 2024-06-08 (×5): 6 via TOPICAL

## 2024-06-04 MED ORDER — HEPARIN (PORCINE) IN NACL 1000-0.9 UT/500ML-% IV SOLN
INTRAVENOUS | Status: DC | PRN
  Administered 2024-06-04: 500 mL

## 2024-06-04 MED ORDER — AMIODARONE LOAD VIA INFUSION
150.0000 mg | Freq: Once | INTRAVENOUS | Status: AC
  Administered 2024-06-04: 150 mg via INTRAVENOUS
  Filled 2024-06-04: qty 83.34

## 2024-06-04 MED ORDER — DILTIAZEM HCL 60 MG PO TABS
120.0000 mg | ORAL_TABLET | Freq: Once | ORAL | Status: AC
  Administered 2024-06-04: 120 mg via ORAL
  Filled 2024-06-04: qty 2

## 2024-06-04 MED ORDER — AMIODARONE HCL IN DEXTROSE 360-4.14 MG/200ML-% IV SOLN
60.0000 mg/h | INTRAVENOUS | Status: AC
  Administered 2024-06-04 (×2): 60 mg/h via INTRAVENOUS
  Filled 2024-06-04: qty 200

## 2024-06-04 MED ORDER — AMIODARONE HCL IN DEXTROSE 360-4.14 MG/200ML-% IV SOLN
30.0000 mg/h | INTRAVENOUS | Status: DC
  Administered 2024-06-04 – 2024-06-07 (×5): 30 mg/h via INTRAVENOUS
  Filled 2024-06-04 (×7): qty 200

## 2024-06-04 MED ORDER — LIDOCAINE HCL (PF) 1 % IJ SOLN
INTRAMUSCULAR | Status: AC
Start: 2024-06-04 — End: 2024-06-04
  Filled 2024-06-04: qty 30

## 2024-06-04 MED ORDER — MIDAZOLAM HCL 2 MG/2ML IJ SOLN
INTRAMUSCULAR | Status: AC
  Filled 2024-06-04: qty 2

## 2024-06-04 SURGICAL SUPPLY — 3 items
EVACUATOR 1/8 PVC DRAIN (DRAIN) IMPLANT
PACK CARDIAC CATHETERIZATION (CUSTOM PROCEDURE TRAY) ×1 IMPLANT
TRAY PERICARDIOCENTESIS 6FX60 (TRAY / TRAY PROCEDURE) IMPLANT

## 2024-06-04 NOTE — Progress Notes (Signed)
 eLink Physician-Brief Progress Note Patient Name: YEIMY BRABANT DOB: 1941-08-04 MRN: [id number]   Date of Service  06/04/2024  HPI/Events of Note  just need diet order- advance as tol    eICU Interventions  ordered     Intervention Category Minor Interventions: Other:  Jodelle ONEIDA Hutching 06/04/2024, 9:26 PM

## 2024-06-04 NOTE — Interval H&P Note (Signed)
 History and Physical Interval Note:  06/04/2024 1:27 PM  Katherine Bennett  has presented today for surgery, with the diagnosis of STEMI.  The various methods of treatment have been discussed with the patient and family. After consideration of risks, benefits and other options for treatment, the patient has consented to  a Pericardiocentesis as a intervention.  The patient's history has been reviewed, patient examined, no change in status, stable for surgery.  I have reviewed the patient's chart and labs.  Questions were answered to the patient's satisfaction.     Maude [facility] 06/04/2024 1:28 PM

## 2024-06-04 NOTE — Progress Notes (Signed)
 OT Cancellation Note  Patient Details Name: Katherine Bennett MRN: [id number] DOB: 12-Jul-1941   Cancelled Treatment:    Reason Eval/Treat Not Completed: Patient at procedure or test/ unavailable (cath lab)  [name] 06/04/2024, 1:31 PM Mliss HERO, OTR/L Acute Rehabilitation Services Office: [phone]

## 2024-06-04 NOTE — Consult Note (Signed)
 NAME:  Katherine Bennett, MRN:  [id number], DOB:  1941/04/09, LOS: 2 ADMISSION DATE:  06/02/2024, CONSULTATION DATE:  06/04/2024 REFERRING MD:  Lue Fallow , CHIEF COMPLAINT: Shortness of breath  History of Present Illness:  83 year old female with hypertension, hyperlipidemia and irritable bowel syndrome who initially presented on 10/17 with increasing shortness of breath and cough for week at rest along hospital, on workup she was noted to have coronavirus NL 63 positive upon admission, she was admitted under hospitalist care with acute hypoxic respiratory failure in the setting of bilateral multifocal pneumonia with pleural effusion.  CT angiogram of chest was done which showed bilateral pleural effusion right more than left, also consistent with large pericardial effusion.  Echocardiogram was done today showing large pericardial effusion with cardiac tamponade physiology even though patient's blood pressure is stable.  Patient was transferred to [facility], underwent pericardiocentesis and pericardial drain in place, 770 cc of hemorrhagic fluid was drained.  Patient was admitted to ICU for close monitoring, PCCM was consulted for help evaluation medical management. After procedure patient stated breathing is much better, denies chest pain, nausea, vomiting or fever  Pertinent  Medical History   Past Medical History:  Diagnosis Date   Erb's palsy    History of squamous cell carcinoma    followed by dermatology   Hyperlipidemia    Hypertension    IBS (irritable bowel syndrome)    Obesity    Osteoporosis 2012     Significant Hospital Events: Including procedures, antibiotic start and stop dates in addition to other pertinent events     Interim History / Subjective:  As above  Objective    Blood pressure 125/68, pulse (!) 112, temperature (!) 97.5 F (36.4 C), temperature source Oral, resp. rate 20, height 5' 3 (1.6 m), weight 86.6 kg, SpO2 97%.       No intake  or output data in the 24 hours ending 06/04/24 1454 Filed Weights   06/02/24 1251 06/04/24 0447  Weight: 77 kg 86.6 kg   Physical exam: General: Elderly female, lying on the bed HEENT: [place]/AT, eyes anicteric.  moist mucus membranes Neuro: Alert, awake following commands Chest: Diminished air entry at the bases bilaterally right more than left, no wheezes or rhonchi Heart: Regular rate and rhythm, no murmurs or gallops.  Pericardial drain in place with serosanguineous fluid output Abdomen: Soft, nontender, nondistended, bowel sounds present  Labs and images reviewed  Resolved problem list   Assessment and Plan  Acute respiratory failure with hypoxia in the setting of coronavirus infection, POA Bilateral pleural effusion Large pericardial effusion with cardiac tamponade physiology status post pericardiocentesis and pericardial drain in place Paroxysmal A-fib in the setting of large pericardial effusion Hypertension Hyperlipidemia Acute kidney injury due to cardiorenal syndrome Obesity  Continue nasal cannula oxygen, titrate with O2 sat goal 92% Continue droplet precautions Continue supportive care Hold diuretics considering patient had large pericardial effusion and AKI now Echocardiogram showed normal biventricular function Currently she is in sinus rhythm, hold off anticoagulation Continue amiodarone Continue as needed hydralazine Holding oral antihypertensive meds Monitor intake and output Avoid nephrotoxic agent Diet and exercise counseling provided   Labs   CBC: Recent Labs  Lab 06/02/24 1305 06/02/24 1338 06/03/24 0438 06/04/24 0439  WBC 10.8*  --  8.5 7.6  NEUTROABS 8.3*  --   --   --   HGB 12.4 13.6 11.9* 11.5*  HCT 42.0 40.0 39.7 38.7  MCV 86.4  --  88.8 87.2  PLT  303  --  256 272    Basic Metabolic Panel: Recent Labs  Lab 06/02/24 1305 06/02/24 1338 06/03/24 0438 06/04/24 0439  NA 138 140 139 137  K 4.0 4.0 4.5 4.4  CL 103 103 103 101  CO2 22   --  22 22  GLUCOSE 160* 160* 142* 143*  BUN 16 16 17 23   CREATININE 0.89 1.00 0.93 1.24*  CALCIUM  9.3  --  9.3 9.5  MG  --   --  1.8  --    GFR: Estimated Creatinine Clearance: 34.6 mL/min (A) (by C-G formula based on SCr of 1.24 mg/dL (H)). Recent Labs  Lab 06/02/24 1305 06/02/24 1337 06/02/24 1639 06/03/24 0438 06/04/24 0439  WBC 10.8*  --   --  8.5 7.6  LATICACIDVEN  --  1.2 1.7  --   --     Liver Function Tests: Recent Labs  Lab 06/02/24 1305  AST 18  ALT 18  ALKPHOS 118  BILITOT 1.6*  PROT 6.7  ALBUMIN 4.1   Recent Labs  Lab 06/02/24 1305  LIPASE 12   No results for input(s): AMMONIA in the last 168 hours.  ABG    Component Value Date/Time   HCO3 27.2 06/02/2024 1305   TCO2 22 06/02/2024 1338   O2SAT 72.3 06/02/2024 1305     Coagulation Profile: No results for input(s): INR, PROTIME in the last 168 hours.  Cardiac Enzymes: Recent Labs  Lab 06/02/24 2051  CKTOTAL 63    HbA1C: No results found for: HGBA1C  CBG: No results for input(s): GLUCAP in the last 168 hours.  Review of Systems:   12 point review of system is significant for complaint mentioned HPI, rest is negative  Past Medical History:  She,  has a past medical history of Erb's palsy, History of squamous cell carcinoma, Hyperlipidemia, Hypertension, IBS (irritable bowel syndrome), Obesity, and Osteoporosis (2012).   Surgical History:   Past Surgical History:  Procedure Laterality Date   ANKLE FRACTURE SURGERY Right    APPENDECTOMY     CHOLECYSTECTOMY     PARTIAL HYSTERECTOMY     THYROIDECTOMY, PARTIAL     Left thyroid     TONSILLECTOMY       Social History:   reports that she quit smoking about 41 years ago. She has never used smokeless tobacco. She reports current alcohol use. She reports that she does not use drugs.   Family History:  Her family history includes Diabetes in her mother; Heart disease in her mother; Hypertension in her mother; Stroke in her  mother.   Allergies Allergies  Allergen Reactions   Losartan  Other (See Comments)    Persistent hyperkalemia   Morphine Other (See Comments)    Delusions/Psychosis/Stated it made her feel crazy   Morphine And Codeine Other (See Comments)    Hallucinations    Spironolactone Other (See Comments)    Hyperkalemia     Home Medications  Prior to Admission medications   Medication Sig Start Date End Date Taking? Authorizing Provider  acetaminophen  (TYLENOL ) 500 MG tablet Take 1,000 mg by mouth every 6 (six) hours as needed (for pain for headaches).   Yes [provider]  amLODipine (NORVASC) 5 MG tablet Take 5 mg by mouth in the morning and at bedtime.   Yes [provider]  aspirin  EC 81 MG tablet Take 81 mg by mouth daily.   Yes [provider]  cetirizine (ZYRTEC) 10 MG tablet Take 10 mg by mouth daily as needed  for allergies or rhinitis.   Yes [provider]  Cholecalciferol (VITAMIN D3) 3000 units TABS Take 3,000 Units by mouth daily.   Yes [provider]  cinacalcet (SENSIPAR) 60 MG tablet Take 60 mg by mouth at bedtime.   Yes [provider]  fluticasone  (FLONASE ) 50 MCG/ACT nasal spray Place 1 spray into both nostrils daily as needed (seasonal allergies).    Yes [provider]  furosemide  (LASIX ) 20 MG tablet Take 1 tablet (20 mg total) by mouth daily. Patient taking differently: Take 20 mg by mouth daily as needed for fluid or edema. 08/07/15  Yes Trixie File, MD  MAGNESIUM CITRATE PO Take 1 tablet by mouth daily.   Yes [provider]  metFORMIN (GLUCOPHAGE-XR) 500 MG 24 hr tablet Take 500 mg by mouth daily with breakfast.   Yes [provider]  NON FORMULARY Take 0.5 Pieces by mouth See admin instructions. THC-based gummie - Chew 0.5 gummie by mouth at bedtime to aid sleep   Yes [provider]  Oral Electrolytes (LIQUID I.V.) PACK Take 1 packet by mouth daily.   Yes [provider]  rosuvastatin (CRESTOR) 20 MG tablet Take 20 mg by mouth at bedtime.   Yes [provider]  Turmeric Curcumin CAPS Take 1 capsule by mouth daily.   Yes [provider]  losartan  (COZAAR ) 100 MG tablet Take 1 tablet (100 mg total) by mouth daily. Patient not taking: Reported on 06/02/2024 03/02/16   Trixie File, MD  naproxen  (NAPROSYN ) 250 MG tablet Take 1 tablet (250 mg total) by mouth 2 (two) times daily as needed (for back pain). Patient not taking: Reported on 06/02/2024 12/16/16   Verdene Purchase, MD    The patient is critically ill due to acute respiratory failure with hypoxia in the setting of bilateral pneumonia and large pericardial effusion with tamponade physiology status post pericardiocentesis and pericardial drain in place..  Critical care was necessary to treat or prevent imminent or life-threatening deterioration.  Critical care was time spent personally by me on the following activities: development of treatment plan with patient and/or surrogate as well as nursing, discussions with consultants, evaluation of patient's response to treatment, examination of patient, obtaining history from patient or surrogate, ordering and performing treatments and interventions, ordering and review of laboratory studies, ordering and review of radiographic studies, pulse oximetry, re-evaluation of patient's condition and participation in multidisciplinary rounds.   During this encounter critical care time was devoted to patient care services described in this note for 40 minutes.     Valinda Novas, MD [facility] See Amion for pager If no response to pager, please call [phone] until 7pm After 7pm, Please call E-link [phone]

## 2024-06-04 NOTE — H&P (View-Only) (Signed)
 CARDIOLOGY CONSULT NOTE       Patient ID: LOVELEE FORNER MRN: [id number] DOB/AGE: November 28, 1940 83 y.o.  Admit date: 06/02/2024 Referring Physician: Lue Primary Physician: Joneen Nest, MD Primary Cardiologist: New Reason for Consultation: Pericardial Effusion  Principal Problem:   Pneumonia   HPI:  83 y.o. admitted with cough and dyspnea. Tested positive for Coronavirus NL63.  CT chest showed large pericardial effusion. Reviewed her echo this am and has large pericardial effusion with tamponade. There is dilated IVC, respiratory variation in LVOT/MV doppler. Largest diameter 2.6 cm and circumferential. Prior to admission active woman. Lives independently. Widowed about a year ago. Still driving. Discussed diagnosis with daughter Maeola and patient. Twin sister Veva is also on her way from [place]. While I was in room patient went into rapid afib rates 150 bpm. Prior to that had SR with PACls. Hemodynamically stable. ECG shows no acute ST elevation or PR depression. She has no chest pain. History of HLD, IBS and HTN.  Prior to admission started on glucophage recently and on statin.   Risks of pericardiocentesis including intubation, laceration of heart, need for emergency surgery discussed with daughter and patient. Willing to proceed with procedure at [facility] I have notified primary service and Dr [place] who is interventional cardiologist on call.   ROS All other systems reviewed and negative except as noted above  Past Medical History:  Diagnosis Date   Erb's palsy    History of squamous cell carcinoma    followed by dermatology   Hyperlipidemia    Hypertension    IBS (irritable bowel syndrome)    Obesity    Osteoporosis 2012    Family History  Problem Relation Age of Onset   Heart disease Mother    Diabetes Mother    Hypertension Mother    Stroke Mother        carotid stenosis    Social History   Socioeconomic History   Marital status: Married     Spouse name: Not on file   Number of children: Not on file   Years of education: Not on file   Highest education level: Not on file  Occupational History   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  Tobacco Use   Smoking status: Former    Current packs/day: 0.00    Types: Cigarettes    Quit date: 09/07/1982    Years since quitting: 41.7   Smokeless tobacco: Never  Substance and Sexual Activity   Alcohol use: Yes    Alcohol/week: 0.0 standard drinks of alcohol    Comment: occasional wine or mimosa   Drug use: No   Sexual activity: Not on file  Other Topics Concern   Not on file  Social History Narrative   Not on file   Social Drivers of Health   Financial Resource Strain: Low Risk  (03/11/2022)   Received from [facility] visits prior to 10/17/2022., [facility]   Overall Financial Resource Strain (CARDIA)    Difficulty of Paying Living Expenses: Not hard at all  Food Insecurity: No Food Insecurity (06/02/2024)   Hunger Vital Sign    Worried About Running Out of Food in the Last Year: Never true    Ran Out of Food in the Last Year: Never true  Transportation Needs: No Transportation Needs (06/02/2024)   PRAPARE - Administrator, Civil Service (Medical): No    Lack of Transportation (Non-Medical): No  Physical Activity: Patient Declined (03/11/2022)   Received from  [facility]   Exercise Vital Sign    On average, how many days per week do you engage in moderate to strenuous exercise (like a brisk walk)?: Patient declined    On average, how many minutes do you engage in exercise at this level?: Patient declined  Stress: Stress Concern Present (03/11/2022)   Received from [facility] visits prior to 10/17/2022., [facility]   [employer] of Occupational Health - Occupational Stress Questionnaire    Feeling of Stress : To some extent  Social Connections: Unknown (06/02/2024)   Social Connection and Isolation  Panel    Frequency of Communication with Friends and Family: Not on file    Frequency of Social Gatherings with Friends and Family: More than three times a week    Attends Religious Services: Not on file    Active Member of Clubs or Organizations: Not on file    Attends Banker Meetings: Not on file    Marital Status: Not on file  Intimate Partner Violence: Not At Risk (06/02/2024)   Humiliation, Afraid, Rape, and Kick questionnaire    Fear of Current or Ex-Partner: No    Emotionally Abused: No    Physically Abused: No    Sexually Abused: No    Past Surgical History:  Procedure Laterality Date   ANKLE FRACTURE SURGERY Right    APPENDECTOMY     CHOLECYSTECTOMY     PARTIAL HYSTERECTOMY     THYROIDECTOMY, PARTIAL     Left thyroid     TONSILLECTOMY        Current Facility-Administered Medications:    acetaminophen  (TYLENOL ) tablet 650 mg, 650 mg, Oral, Q6H PRN, 650 mg at 06/03/24 0959 **OR** acetaminophen  (TYLENOL ) suppository 650 mg, 650 mg, Rectal, Q6H PRN, Pokhrel, Laxman, MD   acetaminophen  (TYLENOL ) tablet 1,000 mg, 1,000 mg, Oral, Q6H PRN, Pokhrel, Laxman, MD, 1,000 mg at 06/03/24 2200   amLODipine (NORVASC) tablet 5 mg, 5 mg, Oral, Daily, Pokhrel, Laxman, MD, 5 mg at 06/04/24 1032   aspirin  EC tablet 81 mg, 81 mg, Oral, Daily, Pokhrel, Laxman, MD, 81 mg at 06/04/24 1032   cholecalciferol (VITAMIN D3) tablet 3,000 Units, 3,000 Units, Oral, Daily, Pokhrel, Laxman, MD, 3,000 Units at 06/04/24 1033   cinacalcet (SENSIPAR) tablet 60 mg, 60 mg, Oral, QHS, Pokhrel, Laxman, MD, 60 mg at 06/03/24 2200   enoxaparin  (LOVENOX ) injection 40 mg, 40 mg, Subcutaneous, QHS, Pokhrel, Laxman, MD, 40 mg at 06/03/24 2201   fluticasone  (FLONASE ) 50 MCG/ACT nasal spray 1 spray, 1 spray, Each Nare, Daily PRN, Pokhrel, Laxman, MD   free water 500 mL, 500 mL, Oral, Once, Delford Maude BROCKS, MD   furosemide  (LASIX ) tablet 20 mg, 20 mg, Oral, Daily PRN, Pokhrel, Laxman, MD    guaiFENesin-dextromethorphan (ROBITUSSIN DM) 100-10 MG/5ML syrup 5 mL, 5 mL, Oral, Q4H PRN, Chavez, Abigail, NP, 5 mL at 06/03/24 2200   hydrALAZINE (APRESOLINE) injection 5 mg, 5 mg, Intravenous, Q6H PRN, Pokhrel, Laxman, MD, 5 mg at 06/03/24 0600   ipratropium-albuterol (DUONEB) 0.5-2.5 (3) MG/3ML nebulizer solution 3 mL, 3 mL, Nebulization, Q3H PRN, Pokhrel, Laxman, MD, 3 mL at 06/02/24 2229   ondansetron  (ZOFRAN ) tablet 4 mg, 4 mg, Oral, Q6H PRN, 4 mg at 06/04/24 0615 **OR** ondansetron  (ZOFRAN ) injection 4 mg, 4 mg, Intravenous, Q6H PRN, Pokhrel, Laxman, MD, 4 mg at 06/03/24 0558   sodium chloride flush (NS) 0.9 % injection 3 mL, 3 mL, Intravenous, Q12H, Pokhrel, Laxman, MD, 3 mL at 06/04/24 1033   sodium  chloride flush (NS) 0.9 % injection 3 mL, 3 mL, Intravenous, PRN, Pokhrel, Laxman, MD  amLODipine  5 mg Oral Daily   aspirin  EC  81 mg Oral Daily   cholecalciferol  3,000 Units Oral Daily   cinacalcet  60 mg Oral QHS   enoxaparin  (LOVENOX ) injection  40 mg Subcutaneous QHS   free water  500 mL Oral Once   sodium chloride flush  3 mL Intravenous Q12H     Physical Exam: Blood pressure 120/76, pulse 60, temperature 97.8 F (36.6 C), temperature source Oral, resp. rate 20, height 5' 3 (1.6 m), weight 86.6 kg, SpO2 93%.    Overweight female in no distress Rhonchi bilateral bases No rub / murmur Abdomen benign Plus one edema JVP elevated  No tachypnea   Labs:   Lab Results  Component Value Date   WBC 7.6 06/04/2024   HGB 11.5 (L) 06/04/2024   HCT 38.7 06/04/2024   MCV 87.2 06/04/2024   PLT 272 06/04/2024    Recent Labs  Lab 06/02/24 1305 06/02/24 1338 06/04/24 0439  NA 138   < > 137  K 4.0   < > 4.4  CL 103   < > 101  CO2 22   < > 22  BUN 16   < > 23  CREATININE 0.89   < > 1.24*  CALCIUM  9.3   < > 9.5  PROT 6.7  --   --   BILITOT 1.6*  --   --   ALKPHOS 118  --   --   ALT 18  --   --   AST 18  --   --   GLUCOSE 160*   < > 143*   < > = values in this interval  not displayed.   Lab Results  Component Value Date   CKTOTAL 63 06/02/2024    Lab Results  Component Value Date   CHOL 150 02/28/2015   Lab Results  Component Value Date   HDL 44 02/28/2015   Lab Results  Component Value Date   LDLCALC 80 02/28/2015   Lab Results  Component Value Date   TRIG 129 02/28/2015   No results found for: CHOLHDL No results found for: LDLDIRECT    Radiology: ECHOCARDIOGRAM COMPLETE Result Date: 06/04/2024    ECHOCARDIOGRAM REPORT   Patient Name:   HALA NARULA Date of Exam: 06/04/2024 Medical Rec #:  [id number]         Height:       63.0 in Accession #:    [id number]        Weight:       190.9 lb Date of Birth:  1941-07-27         BSA:          1.896 m Patient Age:    83 years          BP:           120/76 mmHg Patient Gender: F                 HR:           102 bpm. Exam Location:  Inpatient Procedure: 2D Echo (Both Spectral and Color Flow Doppler were utilized during            procedure). Indications:    Pericardial Effusion  History:        Patient has prior history of Echocardiogram examinations. Risk  Factors:Hypertension.  Sonographer:    Charmaine Gaskins Referring Phys: [id number] ELSIE JAYSON [place] IMPRESSIONS  1. Left ventricular ejection fraction, by estimation, is 60 to 65%. The left ventricle has normal function. The left ventricle has no regional wall motion abnormalities. Left ventricular diastolic parameters are consistent with Grade I diastolic dysfunction (impaired relaxation).  2. Right ventricular systolic function is normal. The right ventricular size is normal.  3. Large pericardial effusion with tamponade. IVC 2.6 mitral and LVOT respiratory variation RV/RA diastolic collapse largest dimension 2.6 cm and circumferential. Large pericardial effusion. The pericardial effusion is circumferential.  4. The mitral valve is abnormal. Trivial mitral valve regurgitation. No evidence of mitral stenosis.  5. The aortic valve is  tricuspid. There is mild calcification of the aortic valve. Aortic valve regurgitation is not visualized. Aortic valve sclerosis is present, with no evidence of aortic valve stenosis.  6. The inferior vena cava is normal in size with greater than 50% respiratory variability, suggesting right atrial pressure of 3 mmHg. FINDINGS  Left Ventricle: Left ventricular ejection fraction, by estimation, is 60 to 65%. The left ventricle has normal function. The left ventricle has no regional wall motion abnormalities. Strain was performed and the global longitudinal strain is indeterminate. The left ventricular internal cavity size was normal in size. There is no left ventricular hypertrophy. Left ventricular diastolic parameters are consistent with Grade I diastolic dysfunction (impaired relaxation). Right Ventricle: The right ventricular size is normal. No increase in right ventricular wall thickness. Right ventricular systolic function is normal. Left [facility]: Left atrial size was normal in size. Right [facility]: Right atrial size was normal in size. Pericardium: Large pericardial effusion with tamponade. IVC 2.6 mitral and LVOT respiratory variation RV/RA diastolic collapse largest dimension 2.6 cm and circumferential. A large pericardial effusion is present. The pericardial effusion is circumferential. There is diastolic collapse of the right ventricular free wall, diastolic collapse of the right atrial wall, excessive respiratory variation in the mitral valve spectral Doppler velocities and excessive respiratory variation in the LVOT spectral Doppler velocities. Mitral Valve: The mitral valve is abnormal. There is mild thickening of the mitral valve leaflet(s). Trivial mitral valve regurgitation. No evidence of mitral valve stenosis. Tricuspid Valve: The tricuspid valve is normal in structure. Tricuspid valve regurgitation is not demonstrated. No evidence of tricuspid stenosis. Aortic Valve: The aortic valve is tricuspid.  There is mild calcification of the aortic valve. Aortic valve regurgitation is not visualized. Aortic valve sclerosis is present, with no evidence of aortic valve stenosis. Pulmonic Valve: The pulmonic valve was normal in structure. Pulmonic valve regurgitation is trivial. No evidence of pulmonic stenosis. Aorta: The aortic root is normal in size and structure. Venous: The inferior vena cava is normal in size with greater than 50% respiratory variability, suggesting right atrial pressure of 3 mmHg. IAS/Shunts: No atrial level shunt detected by color flow Doppler. Additional Comments: 3D was performed not requiring image post processing on an independent workstation and was indeterminate.  LEFT VENTRICLE PLAX 2D LVOT diam:     2.00 cm   Diastology LVOT Area:     3.14 cm  LV e' medial:    8.16 cm/s                          LV E/e' medial:  7.1                          LV e' lateral:  9.36 cm/s                          LV E/e' lateral: 6.2  RIGHT VENTRICLE RV Basal diam:  2.30 cm RV Mid diam:    2.30 cm LEFT ATRIUM             Index        RIGHT ATRIUM          Index LA Vol (A2C):   43.2 ml 22.79 ml/m  RA Area:     8.22 cm LA Vol (A4C):   41.8 ml 22.05 ml/m  RA Volume:   14.00 ml 7.38 ml/m LA Biplane Vol: 42.1 ml 22.21 ml/m   AORTA Ao Root diam: 2.80 cm Ao Asc diam:  3.40 cm MITRAL VALVE MV Area (PHT): 8.43 cm    SHUNTS MV Decel Time: 90 msec     Systemic Diam: 2.00 cm MV E velocity: 57.80 cm/s MV A velocity: 97.30 cm/s MV E/A ratio:  0.59 Maude Emmer MD Electronically signed by Maude Emmer MD Signature Date/Time: 06/04/2024/9:32:34 AM    Final    CT Angio Chest PE W and/or Wo Contrast Result Date: 06/02/2024 EXAM: CTA CHEST 06/02/2024 03:26:50 PM TECHNIQUE: CTA of the chest was performed after the administration of 100 mL of iohexol (OMNIPAQUE) 350 MG/ML injection. Multiplanar reformatted images are provided for review. MIP images are provided for review. Automated exposure control, iterative  reconstruction, and/or weight based adjustment of the mA/kV was utilized to reduce the radiation dose to as low as reasonably achievable. COMPARISON: Chest radiograph of earlier today. CLINICAL HISTORY: Pulmonary embolism (PE) suspected, high prob. Per triage note: Pt came in complaining of SOB. States she was around daughter who had an upper respiratory illness. Pt states it has been ongoing for about a week. Reports feeling generalized aches, coughing and N/D. FINDINGS: PULMONARY ARTERIES: Pulmonary arteries are adequately opacified for evaluation. The quality of evaluation for pulmonary embolism is good. No evidence of pulmonary embolism. Main pulmonary artery is normal in caliber. MEDIASTINUM: Normal heart size with moderate pericardial effusion. This may be complex, measuring greater than fluid density including on image 94/02. LAD and right coronary artery calcification. Atherosclerosis. There is no acute abnormality of the thoracic aorta. LYMPH NODES: No mediastinal, hilar or axillary lymphadenopathy. LUNGS AND PLEURA: Small left and small to moderate right pleural effusions. No loculation. Bibasilar collapse/consolidation. No pneumothorax. UPPER ABDOMEN: Limited images of the upper abdomen demonstrate cholecystectomy. Otherwise, the visualized portions are unremarkable. 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 No acute bone or soft tissue abnormality. IMPRESSION: 1. No evidence of pulmonary embolism. 2. Moderate pericardial effusion, possibly complex. Consider further evaluation with echocardiography. 3. Small left and small to moderate right pleural effusions without loculation. Bibasilar collapse/consolidative change is favored to represent compressive atelectasis . cannot exclude a component of concurrent infection or aspiration. Electronically signed by: Rockey Kilts MD 06/02/2024 04:26 PM EDT RP Workstation: [id number]   CT ABDOMEN PELVIS W CONTRAST Result Date:  06/02/2024 CLINICAL DATA:  Acute abdominal pain.  Nausea.  Diarrhea. EXAM: CT ABDOMEN AND PELVIS WITH CONTRAST TECHNIQUE: Multidetector CT imaging of the abdomen and pelvis was performed using the standard protocol following bolus administration of intravenous contrast. RADIATION DOSE REDUCTION: This exam was performed according to the departmental dose-optimization program which includes automated exposure control, adjustment of the mA and/or kV according to patient size and/or use of iterative reconstruction technique. CONTRAST:  100mL  OMNIPAQUE IOHEXOL 350 MG/ML SOLN COMPARISON:  None Available. FINDINGS: Lower Chest: Large pericardial effusion. Small to moderate bilateral pleural effusions and bibasilar atelectasis. Hepatobiliary: A few tiny sub-cm hepatic cysts are noted. No suspicious hepatic masses identified. Prior cholecystectomy. No evidence of biliary obstruction. Pancreas:  No mass or inflammatory changes. Spleen: Within normal limits in size and appearance. Adrenals/Urinary Tract: No suspicious masses identified. No evidence of ureteral calculi or hydronephrosis. Stomach/Bowel: No evidence of obstruction, inflammatory process or abnormal fluid collections. Vascular/Lymphatic: No pathologically enlarged lymph nodes. No acute vascular findings. Reproductive: Prior hysterectomy. Benign-appearing right ovarian cyst measuring 4.3 by 2.7 x 2.2 cm. Benign-appearing left ovarian cyst measuring 5.8 by 3.6 x 3.1 cm. Tiny amount of free pelvic fluid noted. Other: Tiny fat-containing umbilical hernia. Tiny fat-containing left inguinal hernia. Musculoskeletal:  No suspicious bone lesions identified. IMPRESSION: Large pericardial effusion. Small to moderate bilateral pleural effusions and bibasilar atelectasis. Bilateral benign-appearing ovarian cysts, measuring 5.8 cm on the left, and 4.3 cm on the right. Recommend follow-up US  in 6-12 months. Reference: JACR 2020 Feb; 17(2):248-254 Tiny amount of free pelvic  fluid. Tiny fat-containing umbilical and left inguinal hernias. Electronically Signed   By: Norleen DELENA Kil M.D.   On: 06/02/2024 16:24   DG Chest Portable 1 View Result Date: 06/02/2024 CLINICAL DATA:  sob EXAM: PORTABLE CHEST - 1 VIEW COMPARISON:  December 14, 2016 FINDINGS: Bilateral perihilar interstitial opacities. Hazy airspace opacities in both lung bases with blunting of the costophrenic sulci. No pneumothorax. Moderate cardiomegaly. Tortuous aorta with aortic atherosclerosis. No acute fracture or destructive lesions. Multilevel thoracic osteophytosis. IMPRESSION: 1. Bilateral perihilar interstitial opacities, which may represent atypical/viral infection or interstitial, in the correct clinical context. 2. Hazy airspace opacities in both lung bases with blunting of the costophrenic sulci, likely small layering pleural effusions with bibasilar atelectasis. Alternatively, superimposed bronchopneumonia or aspiration could have this appearance. Electronically Signed   By: Rogelia Myers M.D.   On: 06/02/2024 14:29    EKG: SR PAC nonspecific ST changes    ASSESSMENT AND PLAN:   Pericardial Effusion: Very large on CT and echo. Tamponade physiology. She ate breakfast but now NPO. Not on any blood thinners except lovenox  DVT prophylaxis last night Labs otherwise ok. TTE with normal RV and LV no significant valve dx. Fluid should be very accessible without surgical procedure Start colchicine 0.6 mg bid and consider NSAI or high dose aspirin  post procedure PAF:  just started Likely due to pericardial irritation from COVID and effusion. Hold anticoagulation until after procedure. Load/drip with amiodarone Will give just oral cardizem now so as to not drop BP too much before procedure  COVID:  some atelectasis on CT at bases not a lot of parenchymal dx Duoneb and Rx per primary service   Signed: Maude Emmer 06/04/2024, 11:30 AM

## 2024-06-04 NOTE — Consult Note (Addendum)
 CARDIOLOGY CONSULT NOTE       Patient ID: Katherine Bennett MRN: [id number] DOB/AGE: November 28, 1940 83 y.o.  Admit date: 06/02/2024 Referring Physician: Lue Primary Physician: Katherine Nest, MD Primary Cardiologist: Katherine Bennett Reason for Consultation: Pericardial Effusion  Principal Problem:   Pneumonia   HPI:  83 y.o. admitted with cough and dyspnea. Tested positive for Coronavirus NL63.  CT chest showed large pericardial effusion. Reviewed her echo this am and has large pericardial effusion with tamponade. There is dilated IVC, respiratory variation in LVOT/MV doppler. Largest diameter 2.6 cm and circumferential. Prior to admission active woman. Lives independently. Widowed about a year ago. Still driving. Discussed diagnosis with daughter Katherine Bennett and patient. Twin sister Katherine Bennett is also on her way from [place]. While I was in room patient went into rapid afib rates 150 bpm. Prior to that had SR with PACls. Hemodynamically stable. ECG shows no acute ST elevation or PR depression. She has no chest pain. History of HLD, IBS and HTN.  Prior to admission started on glucophage recently and on statin.   Risks of pericardiocentesis including intubation, laceration of heart, need for emergency surgery discussed with daughter and patient. Willing to proceed with procedure at [facility] I have notified primary service and Katherine Bennett who is interventional cardiologist on call.   ROS All other systems reviewed and negative except as noted above  Past Medical History:  Diagnosis Date   Erb's palsy    History of squamous cell carcinoma    followed by dermatology   Hyperlipidemia    Hypertension    IBS (irritable bowel syndrome)    Obesity    Osteoporosis 2012    Family History  Problem Relation Age of Onset   Heart disease Mother    Diabetes Mother    Hypertension Mother    Stroke Mother        carotid stenosis    Social History   Socioeconomic History   Marital status: Married     Spouse name: Not on file   Number of children: Not on file   Years of education: Not on file   Highest education level: Not on file  Occupational History   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  Tobacco Use   Smoking status: Former    Current packs/day: 0.00    Types: Cigarettes    Quit date: 09/07/1982    Years since quitting: 41.7   Smokeless tobacco: Never  Substance and Sexual Activity   Alcohol use: Yes    Alcohol/week: 0.0 standard drinks of alcohol    Comment: occasional wine or mimosa   Drug use: No   Sexual activity: Not on file  Other Topics Concern   Not on file  Social History Narrative   Not on file   Social Drivers of Health   Financial Resource Strain: Low Risk  (03/11/2022)   Received from [facility] visits prior to 10/17/2022., [facility]   Overall Financial Resource Strain (CARDIA)    Difficulty of Paying Living Expenses: Not hard at all  Food Insecurity: No Food Insecurity (06/02/2024)   Hunger Vital Sign    Worried About Running Out of Food in the Last Year: Never true    Ran Out of Food in the Last Year: Never true  Transportation Needs: No Transportation Needs (06/02/2024)   PRAPARE - Administrator, Civil Service (Medical): No    Lack of Transportation (Non-Medical): No  Physical Activity: Patient Declined (03/11/2022)   Received from  [facility]   Exercise Vital Sign    On average, how many days per week do you engage in moderate to strenuous exercise (like a brisk walk)?: Patient declined    On average, how many minutes do you engage in exercise at this level?: Patient declined  Stress: Stress Concern Present (03/11/2022)   Received from [facility] visits prior to 10/17/2022., [facility]   [employer] of Occupational Health - Occupational Stress Questionnaire    Feeling of Stress : To some extent  Social Connections: Unknown (06/02/2024)   Social Connection and Isolation  Panel    Frequency of Communication with Friends and Family: Not on file    Frequency of Social Gatherings with Friends and Family: More than three times a week    Attends Religious Services: Not on file    Active Member of Clubs or Organizations: Not on file    Attends Banker Meetings: Not on file    Marital Status: Not on file  Intimate Partner Violence: Not At Risk (06/02/2024)   Humiliation, Afraid, Rape, and Kick questionnaire    Fear of Current or Ex-Partner: No    Emotionally Abused: No    Physically Abused: No    Sexually Abused: No    Past Surgical History:  Procedure Laterality Date   ANKLE FRACTURE SURGERY Right    APPENDECTOMY     CHOLECYSTECTOMY     PARTIAL HYSTERECTOMY     THYROIDECTOMY, PARTIAL     Left thyroid     TONSILLECTOMY        Current Facility-Administered Medications:    acetaminophen  (TYLENOL ) tablet 650 mg, 650 mg, Oral, Q6H PRN, 650 mg at 06/03/24 0959 **OR** acetaminophen  (TYLENOL ) suppository 650 mg, 650 mg, Rectal, Q6H PRN, Katherine Bennett, Laxman, MD   acetaminophen  (TYLENOL ) tablet 1,000 mg, 1,000 mg, Oral, Q6H PRN, Katherine Bennett, Laxman, MD, 1,000 mg at 06/03/24 2200   amLODipine (NORVASC) tablet 5 mg, 5 mg, Oral, Daily, Katherine Bennett, Laxman, MD, 5 mg at 06/04/24 1032   aspirin  EC tablet 81 mg, 81 mg, Oral, Daily, Katherine Bennett, Laxman, MD, 81 mg at 06/04/24 1032   cholecalciferol (VITAMIN D3) tablet 3,000 Units, 3,000 Units, Oral, Daily, Katherine Bennett, Laxman, MD, 3,000 Units at 06/04/24 1033   cinacalcet (SENSIPAR) tablet 60 mg, 60 mg, Oral, QHS, Katherine Bennett, Laxman, MD, 60 mg at 06/03/24 2200   enoxaparin  (LOVENOX ) injection 40 mg, 40 mg, Subcutaneous, QHS, Katherine Bennett, Laxman, MD, 40 mg at 06/03/24 2201   fluticasone  (FLONASE ) 50 MCG/ACT nasal spray 1 spray, 1 spray, Each Nare, Daily PRN, Katherine Bennett, Laxman, MD   free water 500 mL, 500 mL, Oral, Once, Katherine Katherine BROCKS, MD   furosemide  (LASIX ) tablet 20 mg, 20 mg, Oral, Daily PRN, Katherine Bennett, Laxman, MD    guaiFENesin-dextromethorphan (ROBITUSSIN DM) 100-10 MG/5ML syrup 5 mL, 5 mL, Oral, Q4H PRN, Bennett, Abigail, NP, 5 mL at 06/03/24 2200   hydrALAZINE (APRESOLINE) injection 5 mg, 5 mg, Intravenous, Q6H PRN, Katherine Bennett, Laxman, MD, 5 mg at 06/03/24 0600   ipratropium-albuterol (DUONEB) 0.5-2.5 (3) MG/3ML nebulizer solution 3 mL, 3 mL, Nebulization, Q3H PRN, Katherine Bennett, Laxman, MD, 3 mL at 06/02/24 2229   ondansetron  (ZOFRAN ) tablet 4 mg, 4 mg, Oral, Q6H PRN, 4 mg at 06/04/24 0615 **OR** ondansetron  (ZOFRAN ) injection 4 mg, 4 mg, Intravenous, Q6H PRN, Katherine Bennett, Laxman, MD, 4 mg at 06/03/24 0558   sodium chloride flush (NS) 0.9 % injection 3 mL, 3 mL, Intravenous, Q12H, Katherine Bennett, Laxman, MD, 3 mL at 06/04/24 1033   sodium  chloride flush (NS) 0.9 % injection 3 mL, 3 mL, Intravenous, PRN, Katherine Bennett, Laxman, MD  amLODipine  5 mg Oral Daily   aspirin  EC  81 mg Oral Daily   cholecalciferol  3,000 Units Oral Daily   cinacalcet  60 mg Oral QHS   enoxaparin  (LOVENOX ) injection  40 mg Subcutaneous QHS   free water  500 mL Oral Once   sodium chloride flush  3 mL Intravenous Q12H     Physical Exam: Blood pressure 120/76, pulse 60, temperature 97.8 F (36.6 C), temperature source Oral, resp. rate 20, height 5' 3 (1.6 m), weight 86.6 kg, SpO2 93%.    Overweight female in no distress Rhonchi bilateral bases No rub / murmur Abdomen benign Plus one edema JVP elevated  No tachypnea   Labs:   Lab Results  Component Value Date   WBC 7.6 06/04/2024   HGB 11.5 (L) 06/04/2024   HCT 38.7 06/04/2024   MCV 87.2 06/04/2024   PLT 272 06/04/2024    Recent Labs  Lab 06/02/24 1305 06/02/24 1338 06/04/24 0439  NA 138   < > 137  K 4.0   < > 4.4  CL 103   < > 101  CO2 22   < > 22  BUN 16   < > 23  CREATININE 0.89   < > 1.24*  CALCIUM  9.3   < > 9.5  PROT 6.7  --   --   BILITOT 1.6*  --   --   ALKPHOS 118  --   --   ALT 18  --   --   AST 18  --   --   GLUCOSE 160*   < > 143*   < > = values in this interval  not displayed.   Lab Results  Component Value Date   CKTOTAL 63 06/02/2024    Lab Results  Component Value Date   CHOL 150 02/28/2015   Lab Results  Component Value Date   HDL 44 02/28/2015   Lab Results  Component Value Date   LDLCALC 80 02/28/2015   Lab Results  Component Value Date   TRIG 129 02/28/2015   No results found for: CHOLHDL No results found for: LDLDIRECT    Radiology: ECHOCARDIOGRAM COMPLETE Result Date: 06/04/2024    ECHOCARDIOGRAM REPORT   Patient Name:   HALA NARULA Date of Exam: 06/04/2024 Medical Rec #:  [id number]         Height:       63.0 in Accession #:    [id number]        Weight:       190.9 lb Date of Birth:  1941-07-27         BSA:          1.896 m Patient Age:    83 years          BP:           120/76 mmHg Patient Gender: F                 HR:           102 bpm. Exam Location:  Inpatient Procedure: 2D Echo (Both Spectral and Color Flow Doppler were utilized during            procedure). Indications:    Pericardial Effusion  History:        Patient has prior history of Echocardiogram examinations. Risk  Factors:Hypertension.  Sonographer:    Katherine Bennett Referring Phys: [id number] ELSIE JAYSON [place]CLA[place] IMPRESSIONS  1. Left ventricular ejection fraction, by estimation, is 60 to 65%. The left ventricle has normal function. The left ventricle has no regional wall motion abnormalities. Left ventricular diastolic parameters are consistent with Grade I diastolic dysfunction (impaired relaxation).  2. Right ventricular systolic function is normal. The right ventricular size is normal.  3. Large pericardial effusion with tamponade. IVC 2.6 mitral and LVOT respiratory variation RV/RA diastolic collapse largest dimension 2.6 cm and circumferential. Large pericardial effusion. The pericardial effusion is circumferential.  4. The mitral valve is abnormal. Trivial mitral valve regurgitation. No evidence of mitral stenosis.  5. The aortic valve is  tricuspid. There is mild calcification of the aortic valve. Aortic valve regurgitation is not visualized. Aortic valve sclerosis is present, with no evidence of aortic valve stenosis.  6. The inferior vena cava is normal in size with greater than 50% respiratory variability, suggesting right atrial pressure of 3 mmHg. FINDINGS  Left Ventricle: Left ventricular ejection fraction, by estimation, is 60 to 65%. The left ventricle has normal function. The left ventricle has no regional wall motion abnormalities. Strain was performed and the global longitudinal strain is indeterminate. The left ventricular internal cavity size was normal in size. There is no left ventricular hypertrophy. Left ventricular diastolic parameters are consistent with Grade I diastolic dysfunction (impaired relaxation). Right Ventricle: The right ventricular size is normal. No increase in right ventricular wall thickness. Right ventricular systolic function is normal. Left [facility]: Left atrial size was normal in size. Right [facility]: Right atrial size was normal in size. Pericardium: Large pericardial effusion with tamponade. IVC 2.6 mitral and LVOT respiratory variation RV/RA diastolic collapse largest dimension 2.6 cm and circumferential. A large pericardial effusion is present. The pericardial effusion is circumferential. There is diastolic collapse of the right ventricular free wall, diastolic collapse of the right atrial wall, excessive respiratory variation in the mitral valve spectral Doppler velocities and excessive respiratory variation in the LVOT spectral Doppler velocities. Mitral Valve: The mitral valve is abnormal. There is mild thickening of the mitral valve leaflet(s). Trivial mitral valve regurgitation. No evidence of mitral valve stenosis. Tricuspid Valve: The tricuspid valve is normal in structure. Tricuspid valve regurgitation is not demonstrated. No evidence of tricuspid stenosis. Aortic Valve: The aortic valve is tricuspid.  There is mild calcification of the aortic valve. Aortic valve regurgitation is not visualized. Aortic valve sclerosis is present, with no evidence of aortic valve stenosis. Pulmonic Valve: The pulmonic valve was normal in structure. Pulmonic valve regurgitation is trivial. No evidence of pulmonic stenosis. Aorta: The aortic root is normal in size and structure. Venous: The inferior vena cava is normal in size with greater than 50% respiratory variability, suggesting right atrial pressure of 3 mmHg. IAS/Shunts: No atrial level shunt detected by color flow Doppler. Additional Comments: 3D was performed not requiring image post processing on an independent workstation and was indeterminate.  LEFT VENTRICLE PLAX 2D LVOT diam:     2.00 cm   Diastology LVOT Area:     3.14 cm  LV e' medial:    8.16 cm/s                          LV E/e' medial:  7.1                          LV e' lateral:  9.36 cm/s                          LV E/e' lateral: 6.2  RIGHT VENTRICLE RV Basal diam:  2.30 cm RV Mid diam:    2.30 cm LEFT ATRIUM             Index        RIGHT ATRIUM          Index LA Vol (A2C):   43.2 ml 22.79 ml/m  RA Area:     8.22 cm LA Vol (A4C):   41.8 ml 22.05 ml/m  RA Volume:   14.00 ml 7.38 ml/m LA Biplane Vol: 42.1 ml 22.21 ml/m   AORTA Ao Root diam: 2.80 cm Ao Asc diam:  3.40 cm MITRAL VALVE MV Area (PHT): 8.43 cm    SHUNTS MV Decel Time: 90 msec     Systemic Diam: 2.00 cm MV E velocity: 57.80 cm/s MV A velocity: 97.30 cm/s MV E/A ratio:  0.59 Katherine Emmer MD Electronically signed by Katherine Emmer MD Signature Date/Time: 06/04/2024/9:32:34 AM    Final    CT Angio Chest PE W and/or Wo Contrast Result Date: 06/02/2024 EXAM: CTA CHEST 06/02/2024 03:26:50 PM TECHNIQUE: CTA of the chest was performed after the administration of 100 mL of iohexol (OMNIPAQUE) 350 MG/ML injection. Multiplanar reformatted images are provided for review. MIP images are provided for review. Automated exposure control, iterative  reconstruction, and/or weight based adjustment of the mA/kV was utilized to reduce the radiation dose to as low as reasonably achievable. COMPARISON: Chest radiograph of earlier today. CLINICAL HISTORY: Pulmonary embolism (PE) suspected, high prob. Per triage note: Pt came in complaining of SOB. States she was around daughter who had an upper respiratory illness. Pt states it has been ongoing for about a week. Reports feeling generalized aches, coughing and N/D. FINDINGS: PULMONARY ARTERIES: Pulmonary arteries are adequately opacified for evaluation. The quality of evaluation for pulmonary embolism is good. No evidence of pulmonary embolism. Main pulmonary artery is normal in caliber. MEDIASTINUM: Normal heart size with moderate pericardial effusion. This may be complex, measuring greater than fluid density including on image 94/02. LAD and right coronary artery calcification. Atherosclerosis. There is no acute abnormality of the thoracic aorta. LYMPH NODES: No mediastinal, hilar or axillary lymphadenopathy. LUNGS AND PLEURA: Small left and small to moderate right pleural effusions. No loculation. Bibasilar collapse/consolidation. No pneumothorax. UPPER ABDOMEN: Limited images of the upper abdomen demonstrate cholecystectomy. Otherwise, the visualized portions are unremarkable. 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 No acute bone or soft tissue abnormality. IMPRESSION: 1. No evidence of pulmonary embolism. 2. Moderate pericardial effusion, possibly complex. Consider further evaluation with echocardiography. 3. Small left and small to moderate right pleural effusions without loculation. Bibasilar collapse/consolidative change is favored to represent compressive atelectasis . cannot exclude a component of concurrent infection or aspiration. Electronically signed by: Katherine Kilts MD 06/02/2024 04:26 PM EDT RP Workstation: [id number]   CT ABDOMEN PELVIS W CONTRAST Result Date:  06/02/2024 CLINICAL DATA:  Acute abdominal pain.  Nausea.  Diarrhea. EXAM: CT ABDOMEN AND PELVIS WITH CONTRAST TECHNIQUE: Multidetector CT imaging of the abdomen and pelvis was performed using the standard protocol following bolus administration of intravenous contrast. RADIATION DOSE REDUCTION: This exam was performed according to the departmental dose-optimization program which includes automated exposure control, adjustment of the mA and/or kV according to patient size and/or use of iterative reconstruction technique. CONTRAST:  100mL  OMNIPAQUE IOHEXOL 350 MG/ML SOLN COMPARISON:  None Available. FINDINGS: Lower Chest: Large pericardial effusion. Small to moderate bilateral pleural effusions and bibasilar atelectasis. Hepatobiliary: A few tiny sub-cm hepatic cysts are noted. No suspicious hepatic masses identified. Prior cholecystectomy. No evidence of biliary obstruction. Pancreas:  No mass or inflammatory changes. Spleen: Within normal limits in size and appearance. Adrenals/Urinary Tract: No suspicious masses identified. No evidence of ureteral calculi or hydronephrosis. Stomach/Bowel: No evidence of obstruction, inflammatory process or abnormal fluid collections. Vascular/Lymphatic: No pathologically enlarged lymph nodes. No acute vascular findings. Reproductive: Prior hysterectomy. Benign-appearing right ovarian cyst measuring 4.3 by 2.7 x 2.2 cm. Benign-appearing left ovarian cyst measuring 5.8 by 3.6 x 3.1 cm. Tiny amount of free pelvic fluid noted. Other: Tiny fat-containing umbilical hernia. Tiny fat-containing left inguinal hernia. Musculoskeletal:  No suspicious bone lesions identified. IMPRESSION: Large pericardial effusion. Small to moderate bilateral pleural effusions and bibasilar atelectasis. Bilateral benign-appearing ovarian cysts, measuring 5.8 cm on the left, and 4.3 cm on the right. Recommend follow-up US  in 6-12 months. Reference: JACR 2020 Feb; 17(2):248-254 Tiny amount of free pelvic  fluid. Tiny fat-containing umbilical and left inguinal hernias. Electronically Signed   By: Katherine Bennett M.D.   On: 06/02/2024 16:24   DG Chest Portable 1 View Result Date: 06/02/2024 CLINICAL DATA:  sob EXAM: PORTABLE CHEST - 1 VIEW COMPARISON:  December 14, 2016 FINDINGS: Bilateral perihilar interstitial opacities. Hazy airspace opacities in both lung bases with blunting of the costophrenic sulci. No pneumothorax. Moderate cardiomegaly. Tortuous aorta with aortic atherosclerosis. No acute fracture or destructive lesions. Multilevel thoracic osteophytosis. IMPRESSION: 1. Bilateral perihilar interstitial opacities, which may represent atypical/viral infection or interstitial, in the correct clinical context. 2. Hazy airspace opacities in both lung bases with blunting of the costophrenic sulci, likely small layering pleural effusions with bibasilar atelectasis. Alternatively, superimposed bronchopneumonia or aspiration could have this appearance. Electronically Signed   By: Rogelia Myers M.D.   On: 06/02/2024 14:29    EKG: SR PAC nonspecific ST changes    ASSESSMENT AND PLAN:   Pericardial Effusion: Very large on CT and echo. Tamponade physiology. She ate breakfast but now NPO. Not on any blood thinners except lovenox  DVT prophylaxis last night Labs otherwise ok. TTE with normal RV and LV no significant valve dx. Fluid should be very accessible without surgical procedure Start colchicine 0.6 mg bid and consider NSAI or high dose aspirin  post procedure PAF:  just started Likely due to pericardial irritation from COVID and effusion. Hold anticoagulation until after procedure. Load/drip with amiodarone Will give just oral cardizem now so as to not drop BP too much before procedure  COVID:  some atelectasis on CT at bases not a lot of parenchymal dx Duoneb and Rx per primary service   Signed: Maude Bennett 06/04/2024, 11:30 AM

## 2024-06-04 NOTE — Progress Notes (Signed)
 Patient developed irregular rhythms, MD notified, see new orders. Patient VSS, 02 sat 92% on 5L [place]. Patient asymptomatic, Consulting civil engineer at bedside, Preparing for transport to [facility].  [facility] was given report and present to take patient.

## 2024-06-04 NOTE — Plan of Care (Signed)

## 2024-06-04 NOTE — Progress Notes (Signed)
 PROGRESS NOTE    Katherine Bennett  FMW:[id number] DOB: 12/14/1940 DOA: 06/02/2024 PCP: Joneen Nest, MD   Brief Narrative:  Patient is a 83 years old female with past medical history of hyperlipidemia hypertension irritable bowel syndrome obesity and osteoporosis presented to hospital with worsening shortness of breath and cough for a week.  Patient tested positive for coronavirus NL 63 at intake, hospitalist called for admission.  Assessment & Plan:   Principal Problem:   Pneumonia   Newly diagnosed pericardial region, large, rule out cardiac tamponade Atrial fibrillation, questionable small - As noted on echo this morning, cardiology aware, tentative plan to transfer to [facility] for further evaluation treatment per cardiology recommendations - She remains stable, without chest pain or hypotension - New A-fib likely secondary to above, defer to cardiology for further indications for treatment, diltiazem x 1 for rate control this morning  Acute hypoxic respiratory failure secondary to coronavirus infection, POA Bilateral pneumonia, likely viral - Coronavirus subtype NL63 positive - COVID-19, flu, RSV negative - Patient is not on oxygen at baseline, currently requiring upwards of 9 to 10 L nasal cannula to maintain sats above 90% - Imaging notable for bilateral atelectasis versus infiltrate, with mild to moderate pleural effusion bilaterally - Continue supportive care, antibiotics discontinued, cultures continue to pend but unlikely concurrent viral and bacterial pneumonia - Blood cultures positive x 1, concern for contaminant given staph species, follow repeat labs - Imaging negative for pulmonary embolism  History of hyperlipidemia - hold statin   Hypertension -continue home amlodipine and losartan     Diabetes mellitus type 2.  A1c of 6.8 from recent lab work Obesity discussed dietary and lifestyle changes  DVT prophylaxis: enoxaparin  (LOVENOX ) injection 40 mg  Start: 06/02/24 2200 Code Status:   Code Status: Full Code Family Communication: At bedside  Status is: Inpatient  Dispo: The patient is from: Home              Anticipated d/c is to: Home              Anticipated d/c date is: 48-72 hours              Patient currently not medically stable for discharge given profound hypoxia from baseline  Consultants:  None  Procedures:  None  Antimicrobials:  None indicated  Subjective: No acute issues or events overnight denies nausea vomiting diarrhea constipation headache fevers chills or chest pain.  Shortness of breath ongoing but markedly improved with supplemental oxygen this morning.  Objective: Vitals:   06/03/24 1458 06/03/24 1932 06/04/24 0447 06/04/24 0600  BP: 137/82 133/85 120/76   Pulse: (!) 106 (!) 108 60   Resp: 18 20 20    Temp: 98.4 F (36.9 C) (!) 97.5 F (36.4 C) 97.8 F (36.6 C)   TempSrc: Oral Oral Oral   SpO2: 95% 93% 97% 93%  Weight:   86.6 kg   Height:       No intake or output data in the 24 hours ending 06/04/24 0814  Filed Weights   06/02/24 1251 06/04/24 0447  Weight: 77 kg 86.6 kg    Examination:  General:  Pleasantly resting in bed, No acute distress. HEENT:  Normocephalic atraumatic.  Sclerae nonicteric, noninjected.  Extraocular movements intact bilaterally. Neck:  Without mass or deformity.  Trachea is midline. Lungs:  Clear to auscultate bilaterally without rhonchi, wheeze, or rales. Heart:  Regular rate and rhythm.  Without murmurs, rubs, or gallops. Abdomen:  Soft, nontender, nondistended.  Without guarding  or rebound. Extremities: Without cyanosis, clubbing, edema, or obvious deformity. Skin:  Warm and dry, no erythema.  Data Reviewed: I have personally reviewed following labs and imaging studies  CBC: Recent Labs  Lab 06/02/24 1305 06/02/24 1338 06/03/24 0438 06/04/24 0439  WBC 10.8*  --  8.5 7.6  NEUTROABS 8.3*  --   --   --   HGB 12.4 13.6 11.9* 11.5*  HCT 42.0 40.0 39.7  38.7  MCV 86.4  --  88.8 87.2  PLT 303  --  256 272   Basic Metabolic Panel: Recent Labs  Lab 06/02/24 1305 06/02/24 1338 06/03/24 0438 06/04/24 0439  NA 138 140 139 137  K 4.0 4.0 4.5 4.4  CL 103 103 103 101  CO2 22  --  22 22  GLUCOSE 160* 160* 142* 143*  BUN 16 16 17 23   CREATININE 0.89 1.00 0.93 1.24*  CALCIUM  9.3  --  9.3 9.5  MG  --   --  1.8  --    GFR: Estimated Creatinine Clearance: 34.6 mL/min (A) (by C-G formula based on SCr of 1.24 mg/dL (H)). Liver Function Tests: Recent Labs  Lab 06/02/24 1305  AST 18  ALT 18  ALKPHOS 118  BILITOT 1.6*  PROT 6.7  ALBUMIN 4.1   Recent Labs  Lab 06/02/24 1305  LIPASE 12   Cardiac Enzymes: Recent Labs  Lab 06/02/24 2051  CKTOTAL 63   BNP (last 3 results) Recent Labs    06/02/24 1305  PROBNP 268.0   Sepsis Labs: Recent Labs  Lab 06/02/24 1337 06/02/24 1639  LATICACIDVEN 1.2 1.7    Recent Results (from the past 240 hours)  Resp panel by RT-PCR (RSV, Flu A&B, Covid) Anterior Nasal Swab     Status: None   Collection Time: 06/02/24  1:05 PM   Specimen: Anterior Nasal Swab  Result Value Ref Range Status   SARS Coronavirus 2 by RT PCR NEGATIVE NEGATIVE Final    Comment: (NOTE) SARS-CoV-2 target nucleic acids are NOT DETECTED.  The SARS-CoV-2 RNA is generally detectable in upper respiratory specimens during the acute phase of infection. The lowest concentration of SARS-CoV-2 viral copies this assay can detect is 138 copies/mL. A negative result does not preclude SARS-Cov-2 infection and should not be used as the sole basis for treatment or other patient management decisions. A negative result may occur with  improper specimen collection/handling, submission of specimen other than nasopharyngeal swab, presence of viral mutation(s) within the areas targeted by this assay, and inadequate number of viral copies(<138 copies/mL). A negative result must be combined with clinical observations, patient history,  and epidemiological information. The expected result is Negative.  Fact Sheet for Patients:  BloggerCourse.com  Fact Sheet for Healthcare Providers:  SeriousBroker.it  This test is no t yet approved or cleared by the [place]  FDA and  has been authorized for detection and/or diagnosis of SARS-CoV-2 by FDA under an Emergency Use Authorization (EUA). This EUA will remain  in effect (meaning this test can be used) for the duration of the COVID-19 declaration under Section 564(b)(1) of the Act, 21 U.S.C.section 360bbb-3(b)(1), unless the authorization is terminated  or revoked sooner.       Influenza A by PCR NEGATIVE NEGATIVE Final   Influenza B by PCR NEGATIVE NEGATIVE Final    Comment: (NOTE) The Xpert Xpress SARS-CoV-2/FLU/RSV plus assay is intended as an aid in the diagnosis of influenza from Nasopharyngeal swab specimens and should not be used as a sole basis for  treatment. Nasal washings and aspirates are unacceptable for Xpert Xpress SARS-CoV-2/FLU/RSV testing.  Fact Sheet for Patients: BloggerCourse.com  Fact Sheet for Healthcare Providers: SeriousBroker.it  This test is not yet approved or cleared by the [place]  FDA and has been authorized for detection and/or diagnosis of SARS-CoV-2 by FDA under an Emergency Use Authorization (EUA). This EUA will remain in effect (meaning this test can be used) for the duration of the COVID-19 declaration under Section 564(b)(1) of the Act, 21 U.S.C. section 360bbb-3(b)(1), unless the authorization is terminated or revoked.     Resp Syncytial Virus by PCR NEGATIVE NEGATIVE Final    Comment: (NOTE) Fact Sheet for Patients: BloggerCourse.com  Fact Sheet for Healthcare Providers: SeriousBroker.it  This test is not yet approved or cleared by the [place]  FDA and has  been authorized for detection and/or diagnosis of SARS-CoV-2 by FDA under an Emergency Use Authorization (EUA). This EUA will remain in effect (meaning this test can be used) for the duration of the COVID-19 declaration under Section 564(b)(1) of the Act, 21 U.S.C. section 360bbb-3(b)(1), unless the authorization is terminated or revoked.  Performed at [facility], [address] [place], [place] [address]   Respiratory (~20 pathogens) panel by PCR     Status: Abnormal   Collection Time: 06/02/24  1:05 PM   Specimen: Nasopharyngeal Swab; Respiratory  Result Value Ref Range Status   Adenovirus NOT DETECTED NOT DETECTED Final   Coronavirus 229E NOT DETECTED NOT DETECTED Final    Comment: (NOTE) The Coronavirus on the Respiratory Panel, DOES NOT test for the novel  Coronavirus (2019 nCoV)    Coronavirus HKU1 NOT DETECTED NOT DETECTED Final   Coronavirus NL63 DETECTED (A) NOT DETECTED Final   Coronavirus OC43 NOT DETECTED NOT DETECTED Final   Metapneumovirus NOT DETECTED NOT DETECTED Final   Rhinovirus / Enterovirus NOT DETECTED NOT DETECTED Final   Influenza A NOT DETECTED NOT DETECTED Final   Influenza B NOT DETECTED NOT DETECTED Final   Parainfluenza Virus 1 NOT DETECTED NOT DETECTED Final   Parainfluenza Virus 2 NOT DETECTED NOT DETECTED Final   Parainfluenza Virus 3 NOT DETECTED NOT DETECTED Final   Parainfluenza Virus 4 NOT DETECTED NOT DETECTED Final   Respiratory Syncytial Virus NOT DETECTED NOT DETECTED Final   Bordetella pertussis NOT DETECTED NOT DETECTED Final   Bordetella Parapertussis NOT DETECTED NOT DETECTED Final   Chlamydophila pneumoniae NOT DETECTED NOT DETECTED Final   Mycoplasma pneumoniae NOT DETECTED NOT DETECTED Final    Comment: Performed at [facility], [address], [place], [place] [address]  Blood culture (routine x 2)     Status: None (Preliminary result)   Collection Time: 06/02/24  1:28 PM   Specimen: BLOOD  Result  Value Ref Range Status   Specimen Description   Final    BLOOD LEFT ANTECUBITAL Performed at [facility], [address], [place], [place] [address]    Special Requests   Final    BOTTLES DRAWN AEROBIC AND ANAEROBIC Blood Culture results may not be optimal due to an inadequate volume of blood received in culture bottles Performed at [facility], [address], [place], [place] [address]    Culture  Setup Time   Final    GRAM POSITIVE COCCI ANAEROBIC BOTTLE ONLY CRITICAL RESULT CALLED TO, READ BACK BY AND VERIFIED WITH: PHARMD Bard Jeans on [phone] @1255  by SM Performed at [facility], [address], [place], [place] [address]  Culture GRAM POSITIVE COCCI  Final   Report Status PENDING  Incomplete  Blood Culture ID Panel (Reflexed)     Status: Abnormal   Collection Time: 06/02/24  1:28 PM  Result Value Ref Range Status   Enterococcus faecalis NOT DETECTED NOT DETECTED Final   Enterococcus Faecium NOT DETECTED NOT DETECTED Final   Listeria monocytogenes NOT DETECTED NOT DETECTED Final   Staphylococcus species DETECTED (A) NOT DETECTED Final    Comment: CRITICAL RESULT CALLED TO, READ BACK BY AND VERIFIED WITH: PHARMD Bard Jeans on [phone] @1255  by SM    Staphylococcus aureus (BCID) NOT DETECTED NOT DETECTED Final   Staphylococcus epidermidis DETECTED (A) NOT DETECTED Final    Comment: CRITICAL RESULT CALLED TO, READ BACK BY AND VERIFIED WITH: PHARMD Bard Jeans on [phone] @1255  by SM    Staphylococcus lugdunensis NOT DETECTED NOT DETECTED Final   Streptococcus species NOT DETECTED NOT DETECTED Final   Streptococcus agalactiae NOT DETECTED NOT DETECTED Final   Streptococcus pneumoniae NOT DETECTED NOT DETECTED Final   Streptococcus pyogenes NOT DETECTED NOT DETECTED Final   A.calcoaceticus-baumannii NOT DETECTED NOT DETECTED Final   Bacteroides fragilis NOT DETECTED NOT DETECTED Final   Enterobacterales NOT DETECTED NOT DETECTED  Final   Enterobacter cloacae complex NOT DETECTED NOT DETECTED Final   Escherichia coli NOT DETECTED NOT DETECTED Final   Klebsiella aerogenes NOT DETECTED NOT DETECTED Final   Klebsiella oxytoca NOT DETECTED NOT DETECTED Final   Klebsiella pneumoniae NOT DETECTED NOT DETECTED Final   Proteus species NOT DETECTED NOT DETECTED Final   Salmonella species NOT DETECTED NOT DETECTED Final   Serratia marcescens NOT DETECTED NOT DETECTED Final   Haemophilus influenzae NOT DETECTED NOT DETECTED Final   Neisseria meningitidis NOT DETECTED NOT DETECTED Final   Pseudomonas aeruginosa NOT DETECTED NOT DETECTED Final   Stenotrophomonas maltophilia NOT DETECTED NOT DETECTED Final   Candida albicans NOT DETECTED NOT DETECTED Final   Candida auris NOT DETECTED NOT DETECTED Final   Candida glabrata NOT DETECTED NOT DETECTED Final   Candida krusei NOT DETECTED NOT DETECTED Final   Candida parapsilosis NOT DETECTED NOT DETECTED Final   Candida tropicalis NOT DETECTED NOT DETECTED Final   Cryptococcus neoformans/gattii NOT DETECTED NOT DETECTED Final   Methicillin resistance mecA/C NOT DETECTED NOT DETECTED Final    Comment: Performed at [facility], [address], [place], [place] [address]  Blood culture (routine x 2)     Status: None (Preliminary result)   Collection Time: 06/02/24  8:51 PM   Specimen: BLOOD RIGHT ARM  Result Value Ref Range Status   Specimen Description   Final    BLOOD RIGHT ARM Performed at [facility], [address] [place], [place] [address]    Special Requests   Final    BOTTLES DRAWN AEROBIC AND ANAEROBIC Blood Culture adequate volume Performed at [facility], [address] [place], [place] [address]    Culture   Final    NO GROWTH < 12 HOURS Performed at [facility], [address] [place], [place] [address]    Report Status PENDING  Incomplete         Radiology Studies: CT Angio Chest PE W and/or Wo Contrast Result  Date: 06/02/2024 EXAM: CTA CHEST 06/02/2024 03:26:50 PM TECHNIQUE: CTA of the chest was performed after the administration of 100 mL of iohexol (OMNIPAQUE) 350 MG/ML injection. Multiplanar reformatted images are provided for review. MIP images are provided for review. Automated  exposure control, iterative reconstruction, and/or weight based adjustment of the mA/kV was utilized to reduce the radiation dose to as low as reasonably achievable. COMPARISON: Chest radiograph of earlier today. CLINICAL HISTORY: Pulmonary embolism (PE) suspected, high prob. Per triage note: Pt came in complaining of SOB. States she was around daughter who had an upper respiratory illness. Pt states it has been ongoing for about a week. Reports feeling generalized aches, coughing and N/D. FINDINGS: PULMONARY ARTERIES: Pulmonary arteries are adequately opacified for evaluation. The quality of evaluation for pulmonary embolism is good. No evidence of pulmonary embolism. Main pulmonary artery is normal in caliber. MEDIASTINUM: Normal heart size with moderate pericardial effusion. This may be complex, measuring greater than fluid density including on image 94/02. LAD and right coronary artery calcification. Atherosclerosis. There is no acute abnormality of the thoracic aorta. LYMPH NODES: No mediastinal, hilar or axillary lymphadenopathy. LUNGS AND PLEURA: Small left and small to moderate right pleural effusions. No loculation. Bibasilar collapse/consolidation. No pneumothorax. UPPER ABDOMEN: Limited images of the upper abdomen demonstrate cholecystectomy. Otherwise, the visualized portions are unremarkable. 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 No acute bone or soft tissue abnormality. IMPRESSION: 1. No evidence of pulmonary embolism. 2. Moderate pericardial effusion, possibly complex. Consider further evaluation with echocardiography. 3. Small left and small to moderate right pleural effusions without  loculation. Bibasilar collapse/consolidative change is favored to represent compressive atelectasis . cannot exclude a component of concurrent infection or aspiration. Electronically signed by: Rockey Kilts MD 06/02/2024 04:26 PM EDT RP Workstation: [id number]   CT ABDOMEN PELVIS W CONTRAST Result Date: 06/02/2024 CLINICAL DATA:  Acute abdominal pain.  Nausea.  Diarrhea. EXAM: CT ABDOMEN AND PELVIS WITH CONTRAST TECHNIQUE: Multidetector CT imaging of the abdomen and pelvis was performed using the standard protocol following bolus administration of intravenous contrast. RADIATION DOSE REDUCTION: This exam was performed according to the departmental dose-optimization program which includes automated exposure control, adjustment of the mA and/or kV according to patient size and/or use of iterative reconstruction technique. CONTRAST:  100mL OMNIPAQUE IOHEXOL 350 MG/ML SOLN COMPARISON:  None Available. FINDINGS: Lower Chest: Large pericardial effusion. Small to moderate bilateral pleural effusions and bibasilar atelectasis. Hepatobiliary: A few tiny sub-cm hepatic cysts are noted. No suspicious hepatic masses identified. Prior cholecystectomy. No evidence of biliary obstruction. Pancreas:  No mass or inflammatory changes. Spleen: Within normal limits in size and appearance. Adrenals/Urinary Tract: No suspicious masses identified. No evidence of ureteral calculi or hydronephrosis. Stomach/Bowel: No evidence of obstruction, inflammatory process or abnormal fluid collections. Vascular/Lymphatic: No pathologically enlarged lymph nodes. No acute vascular findings. Reproductive: Prior hysterectomy. Benign-appearing right ovarian cyst measuring 4.3 by 2.7 x 2.2 cm. Benign-appearing left ovarian cyst measuring 5.8 by 3.6 x 3.1 cm. Tiny amount of free pelvic fluid noted. Other: Tiny fat-containing umbilical hernia. Tiny fat-containing left inguinal hernia. Musculoskeletal:  No suspicious bone lesions identified. IMPRESSION:  Large pericardial effusion. Small to moderate bilateral pleural effusions and bibasilar atelectasis. Bilateral benign-appearing ovarian cysts, measuring 5.8 cm on the left, and 4.3 cm on the right. Recommend follow-up US  in 6-12 months. Reference: JACR 2020 Feb; 17(2):248-254 Tiny amount of free pelvic fluid. Tiny fat-containing umbilical and left inguinal hernias. Electronically Signed   By: Norleen DELENA Kil M.D.   On: 06/02/2024 16:24   DG Chest Portable 1 View Result Date: 06/02/2024 CLINICAL DATA:  sob EXAM: PORTABLE CHEST - 1 VIEW COMPARISON:  December 14, 2016 FINDINGS: Bilateral perihilar interstitial opacities. Hazy airspace opacities in both lung bases with  blunting of the costophrenic sulci. No pneumothorax. Moderate cardiomegaly. Tortuous aorta with aortic atherosclerosis. No acute fracture or destructive lesions. Multilevel thoracic osteophytosis. IMPRESSION: 1. Bilateral perihilar interstitial opacities, which may represent atypical/viral infection or interstitial, in the correct clinical context. 2. Hazy airspace opacities in both lung bases with blunting of the costophrenic sulci, likely small layering pleural effusions with bibasilar atelectasis. Alternatively, superimposed bronchopneumonia or aspiration could have this appearance. Electronically Signed   By: Rogelia Myers M.D.   On: 06/02/2024 14:29        Scheduled Meds:  amLODipine  5 mg Oral Daily   aspirin  EC  81 mg Oral Daily   cholecalciferol  3,000 Units Oral Daily   cinacalcet  60 mg Oral QHS   enoxaparin  (LOVENOX ) injection  40 mg Subcutaneous QHS   sodium chloride flush  3 mL Intravenous Q12H   Continuous Infusions:     LOS: 2 days   Time spent:  Elsie JAYSON Montclair, DO [facility]  If 7PM-7AM, please contact night-coverage [email or web address]  06/04/2024, 8:14 AM

## 2024-06-05 ENCOUNTER — Inpatient Hospital Stay (HOSPITAL_COMMUNITY)

## 2024-06-05 DIAGNOSIS — I314 Cardiac tamponade: Secondary | ICD-10-CM | POA: Diagnosis not present

## 2024-06-05 DIAGNOSIS — I3139 Other pericardial effusion (noninflammatory): Secondary | ICD-10-CM

## 2024-06-05 LAB — CBC
HCT: 38.8 % (ref 36.0–46.0)
Hemoglobin: 12.1 g/dL (ref 12.0–15.0)
MCH: 26.4 pg (ref 26.0–34.0)
MCHC: 31.2 g/dL (ref 30.0–36.0)
MCV: 84.5 fL (ref 80.0–100.0)
Platelets: 230 K/uL (ref 150–400)
RBC: 4.59 MIL/uL (ref 3.87–5.11)
RDW: 13.6 % (ref 11.5–15.5)
WBC: 6 K/uL (ref 4.0–10.5)
nRBC: 0 % (ref 0.0–0.2)

## 2024-06-05 LAB — BASIC METABOLIC PANEL WITH GFR
Anion gap: 11 (ref 5–15)
Anion gap: 10 (ref 5–15)
BUN: 18 mg/dL (ref 8–23)
BUN: 15 mg/dL (ref 8–23)
CO2: 23 mmol/L (ref 22–32)
CO2: 26 mmol/L (ref 22–32)
Calcium: 8.5 mg/dL — ABNORMAL LOW (ref 8.9–10.3)
Calcium: 8.3 mg/dL — ABNORMAL LOW (ref 8.9–10.3)
Chloride: 104 mmol/L (ref 98–111)
Chloride: 103 mmol/L (ref 98–111)
Creatinine, Ser: 0.97 mg/dL (ref 0.44–1.00)
Creatinine, Ser: 1 mg/dL (ref 0.44–1.00)
GFR, Estimated: 57 mL/min — ABNORMAL LOW (ref >60)
GFR, Estimated: 55 mL/min — ABNORMAL LOW (ref >60)
Glucose, Bld: 114 mg/dL — ABNORMAL HIGH (ref 70–99)
Glucose, Bld: 146 mg/dL — ABNORMAL HIGH (ref 70–99)
Potassium: 3.6 mmol/L (ref 3.5–5.1)
Potassium: 4.1 mmol/L (ref 3.5–5.1)
Sodium: 138 mmol/L (ref 135–145)
Sodium: 139 mmol/L (ref 135–145)

## 2024-06-05 LAB — ECHOCARDIOGRAM LIMITED
Height: 63 in
S' Lateral: 2.7 cm
Weight: 3040.58 [oz_av]

## 2024-06-05 LAB — CULTURE, BLOOD (ROUTINE X 2)

## 2024-06-05 LAB — MAGNESIUM
Magnesium: 1.6 mg/dL — ABNORMAL LOW (ref 1.7–2.4)
Magnesium: 2 mg/dL (ref 1.7–2.4)

## 2024-06-05 LAB — TROPONIN I (HIGH SENSITIVITY): Troponin I (High Sensitivity): 42 ng/L — ABNORMAL HIGH (ref <18)

## 2024-06-05 MED ORDER — POTASSIUM CHLORIDE CRYS ER 20 MEQ PO TBCR
40.0000 meq | EXTENDED_RELEASE_TABLET | Freq: Once | ORAL | Status: AC
  Administered 2024-06-05: 40 meq via ORAL
  Filled 2024-06-05: qty 2

## 2024-06-05 MED ORDER — MAGNESIUM SULFATE 4 GM/100ML IV SOLN
4.0000 g | Freq: Once | INTRAVENOUS | Status: AC
  Administered 2024-06-05: 4 g via INTRAVENOUS
  Filled 2024-06-05: qty 100

## 2024-06-05 MED ORDER — SENNOSIDES-DOCUSATE SODIUM 8.6-50 MG PO TABS
1.0000 | ORAL_TABLET | Freq: Every day | ORAL | Status: DC
  Administered 2024-06-05 – 2024-06-08 (×3): 1 via ORAL
  Filled 2024-06-05 (×3): qty 1

## 2024-06-05 MED ORDER — FUROSEMIDE 10 MG/ML IJ SOLN
40.0000 mg | Freq: Once | INTRAMUSCULAR | Status: AC
  Administered 2024-06-05: 40 mg via INTRAVENOUS
  Filled 2024-06-05: qty 4

## 2024-06-05 MED ORDER — ROSUVASTATIN CALCIUM 20 MG PO TABS
20.0000 mg | ORAL_TABLET | Freq: Every evening | ORAL | Status: DC
  Administered 2024-06-05 – 2024-06-07 (×3): 20 mg via ORAL
  Filled 2024-06-05 (×3): qty 1

## 2024-06-05 MED ORDER — ORAL CARE MOUTH RINSE: 15.0000 mL | OROMUCOSAL | Status: DC | PRN

## 2024-06-05 MED ORDER — FAMOTIDINE 20 MG PO TABS
20.0000 mg | ORAL_TABLET | Freq: Every day | ORAL | Status: DC
  Administered 2024-06-05 – 2024-06-08 (×4): 20 mg via ORAL
  Filled 2024-06-05 (×4): qty 1

## 2024-06-05 NOTE — Evaluation (Signed)
 Occupational Therapy Evaluation Patient Details Name: Katherine Bennett MRN: [id number] DOB: [birth year] Today's Date: 06/05/2024   History of Present Illness   83 y/o F presenting to [facility] ED on 10/19 with worsening SOB and cough x1 week, found to have PNA, positive for coronavirus subtype NL63. Found to have newly diagnosed pericardial effusion R>L, with tamponade in the setting of URI.  Transferred to [facility] for further treatment. S/p pericardiocentesis (drained 770cc of fluid) and drain in place.    PMH includes HLD, HTN, IBS, obesity, osteoporosis, Erb's palsy     Clinical Impressions Pt ind at baseline with ADLs/functional mobility, lives alone but will have close to 24/7 assist from her daughters at d/c. Pt currently needs up to min A for ADLs, and CGA for transfers without AD. Pt able to ambulate to bathroom for toileting and standing grooming task. SpO2 down to 88% on RA, incr to 90s on 2L O2. Pt completing hall ambulation pushing IV pole. Pt presenting with impairments listed below, will follow acutely. Recommend HHOT at d/c pending progression.     If plan is discharge home, recommend the following:   A little help with walking and/or transfers;A little help with bathing/dressing/bathroom;Assistance with cooking/housework;Direct supervision/assist for medications management;Direct supervision/assist for financial management;Assist for transportation;Help with stairs or ramp for entrance     Functional Status Assessment   Patient has had a recent decline in their functional status and demonstrates the ability to make significant improvements in function in a reasonable and predictable amount of time.     Equipment Recommendations   Tub/shower seat     Recommendations for Other Services   PT consult     Precautions/Restrictions   Precautions Precautions: Fall;Other (comment) Precaution/Restrictions Comments: Monitor O2; chest drain Restrictions Weight Bearing  Restrictions Per Provider Order: No     Mobility Bed Mobility               General bed mobility comments: OOB in chair    Transfers Overall transfer level: Needs assistance Equipment used: None Transfers: Sit to/from Stand Sit to Stand: Contact guard assist                  Balance Overall balance assessment: Needs assistance Sitting-balance support: No upper extremity supported, Feet supported Sitting balance-Leahy Scale: Good     Standing balance support: No upper extremity supported Standing balance-Leahy Scale: Fair Standing balance comment: pushes IV pole                           ADL either performed or assessed with clinical judgement   ADL Overall ADL's : Needs assistance/impaired Eating/Feeding: Set up;Sitting   Grooming: Contact guard assist   Upper Body Bathing: Minimal assistance   Lower Body Bathing: Minimal assistance   Upper Body Dressing : Minimal assistance   Lower Body Dressing: Minimal assistance   Toilet Transfer: Contact guard assist;Ambulation   Toileting- Clothing Manipulation and Hygiene: Contact guard assist       Functional mobility during ADLs: Contact guard assist       Vision   Vision Assessment?: No apparent visual deficits     Perception Perception: Not tested       Praxis Praxis: Not tested       Pertinent Vitals/Pain Pain Assessment Pain Assessment: Faces Pain Score: 3  Faces Pain Scale: Hurts little more Pain Location: L side of back Pain Descriptors / Indicators: Discomfort Pain Intervention(s): Limited activity within patient's tolerance, Monitored  during session, Repositioned     Extremity/Trunk Assessment Upper Extremity Assessment Upper Extremity Assessment: Overall WFL for tasks assessed   Lower Extremity Assessment Lower Extremity Assessment: Defer to PT evaluation   Cervical / Trunk Assessment Cervical / Trunk Assessment: Normal   Communication  Communication Communication: Impaired Factors Affecting Communication: Hearing impaired   Cognition Arousal: Alert Behavior During Therapy: WFL for tasks assessed/performed, Impulsive Cognition: Cognition impaired       Memory impairment (select all impairments): Short-term memory     OT - Cognition Comments: possible related to [facility], repeats questions and needs incr time to respond                 Following commands: Intact       Cueing  General Comments   Cueing Techniques: Verbal cues  SpO2 down to 88% on RA, incr to 90s with 2L O2 donned   Exercises     Shoulder Instructions      Home Living Family/patient expects to be discharged to:: Private residence Living Arrangements: Alone Available Help at Discharge: Family;Available 24 hours/day Type of Home: House Home Access: Stairs to enter [employer] of Steps: 2 Entrance Stairs-Rails: None Home Layout: Two level;Bed/bath upstairs Alternate Level Stairs-Number of Steps: 16 Alternate Level Stairs-Rails: Right Bathroom Shower/Tub: Producer, television/film/video: Handicapped height     Home Equipment: Agricultural consultant (2 wheels);Cane - single point          Prior Functioning/Environment Prior Level of Function : Independent/Modified Independent;Driving             Mobility Comments: ind      OT Problem List: Decreased strength;Decreased range of motion;Decreased activity tolerance;Impaired balance (sitting and/or standing);Decreased cognition;Decreased safety awareness;Cardiopulmonary status limiting activity   OT Treatment/Interventions: Self-care/ADL training;Therapeutic exercise;Energy conservation;DME and/or AE instruction;Therapeutic activities;Patient/family education;Balance training      OT Goals(Current goals can be found in the care plan section)   Acute Rehab OT Goals Patient Stated Goal: did not state OT Goal Formulation: With patient Time For Goal Achievement:  06/19/24 Potential to Achieve Goals: Good ADL Goals Pt Will Perform Grooming: with supervision;standing Pt Will Perform Upper Body Dressing: Independently;sitting Pt Will Perform Lower Body Dressing: Independently;sitting/lateral leans;sit to/from stand Pt Will Transfer to Toilet: Independently;ambulating;regular height toilet   OT Frequency:  Min 2X/week    Co-evaluation              AM-PAC OT 6 Clicks Daily Activity     Outcome Measure Help from another person eating meals?: A Little Help from another person taking care of personal grooming?: A Little Help from another person toileting, which includes using toliet, bedpan, or urinal?: A Little Help from another person bathing (including washing, rinsing, drying)?: A Little Help from another person to put on and taking off regular upper body clothing?: A Little Help from another person to put on and taking off regular lower body clothing?: A Little 6 Click Score: 18   End of Session Equipment Utilized During Treatment: Oxygen Nurse Communication: Mobility status  Activity Tolerance: Patient tolerated treatment well Patient left: with call bell/phone within reach;in chair;with family/visitor present  OT Visit Diagnosis: Unsteadiness on feet (R26.81);Other abnormalities of gait and mobility (R26.89);Muscle weakness (generalized) (M62.81)                Time: 8565-8490 OT Time Calculation (min): 35 min Charges:  OT General Charges $OT Visit: 1 Visit OT Evaluation $OT Eval Moderate Complexity: 1 Mod OT Treatments $Self Care/Home Management :  8-22 mins  Michaela Broski K, OTD, OTR/L SecureChat Preferred Acute Rehab [phone]   Laneta MARLA Pereyra 06/05/2024, 4:15 PM

## 2024-06-05 NOTE — Progress Notes (Signed)
 [facility] ADULT ICU REPLACEMENT PROTOCOL   The patient does apply for the [facility] Adult ICU Electrolyte Replacment Protocol based on the criteria listed below:   1.Exclusion criteria: TCTS, ECMO, Dialysis, and Myasthenia Gravis patients 2. Is GFR >/= 30 ml/min? Yes.    Patient's GFR today is 57 3. Is SCr </= 2? Yes.   Patient's SCr is 0.97 mg/dL 4. Did SCr increase >/= 0.5 in 24 hours? No. 5.Pt's weight >40kg  Yes.   6. Abnormal electrolyte(s): K+ 3.6, Mag 1.6  7. Electrolytes replaced per protocol 8.  Call MD STAT for K+ </= 2.5, Phos </= 1, or Mag </= 1 Physician:  Dr. Rober Rummer, Recardo ORN 06/05/2024 5:04 AM

## 2024-06-05 NOTE — Progress Notes (Signed)
 Echocardiogram 2D Echocardiogram has been performed.  Thea Norlander 06/05/2024, 11:16 AM

## 2024-06-05 NOTE — Progress Notes (Signed)
   06/05/24 0905  Spiritual Encounters  Type of Visit Initial  Care provided to: Pt and family  Referral source Clinical staff  Reason for visit Advance directives  OnCall Visit No  Spiritual Framework  Presenting Themes Impactful experiences and emotions  Community/Connection Family  Patient Stress Factors Health changes  Family Stress Factors Health changes  Interventions  Spiritual Care Interventions Made Compassionate presence;Established relationship of care and support  Intervention Outcomes  Outcomes Awareness of health;Awareness of support   Chaplain visited PT: Pt was asleep at the time of visit. Daughter was present at bedside. Chaplain discussed the Advance Directive with the daughter regarding the Pt's status.  She stated she would review it and discuss it with her sister before making a decision. If they decide to complete it, they will contact the chaplain for assistance.   Chaplain thanked her for her time and  reminded her that Chaplain services remain available as needed.

## 2024-06-05 NOTE — Progress Notes (Signed)
 Physical Therapy Treatment Patient Details Name: Katherine Bennett MRN: [id number] DOB: 1941/03/25 Today's Date: 06/05/2024   History of Present Illness 83 y/o F presenting to [facility] ED on 10/19 with worsening SOB and cough x1 week, found to have PNA, positive for coronavirus subtype NL63. Found to have newly diagnosed pericardial effusion R>L, with tamponade in the setting of URI.  Transferred to [facility] for further treatment. S/p pericardiocentesis (drained 770cc of fluid) and drain in place.    PMH includes HLD, HTN, IBS, obesity, osteoporosis, Erb's palsy.    PT Comments  Tolerated visit well. She required CGA for transfers and up to min assist for gait due to loss of balance without assistive device. Steady with single UE support on IV pole, suggest trying SPC or RW next visit. SpO2 90% at rest on RA, 87% while ambulating on RA. 94% on 1L while ambulating. Educated on exercises for LEs, encouraged IS use. BERG balance score with elevated fall risk. Patient will continue to benefit from skilled physical therapy services to further improve independence with functional mobility. Recs updated for OPPT follow-up.     If plan is discharge home, recommend the following: A little help with walking and/or transfers;A little help with bathing/dressing/bathroom;Assistance with cooking/housework;Help with stairs or ramp for entrance   Can travel by private vehicle        Equipment Recommendations  None recommended by PT    Recommendations for Other Services       Precautions / Restrictions Precautions Precautions: Fall;Other (comment) Recall of Precautions/Restrictions: Intact Precaution/Restrictions Comments: Monitor O2; chest drain Restrictions Weight Bearing Restrictions Per Provider Order: No     Mobility  Bed Mobility               General bed mobility comments: In recliner    Transfers Overall transfer level: Needs assistance Equipment used: None Transfers: Sit to/from  Stand Sit to Stand: Contact guard assist           General transfer comment: CGA for safety, good hand placement, slow to rise, reaches for furniture to steady initially.    Ambulation/Gait Ambulation/Gait assistance: Min Chemical engineer (Feet): 180 Feet Assistive device: IV Pole, None Gait Pattern/deviations: Step-through pattern, Decreased stride length, Staggering left Gait velocity: dec Gait velocity interpretation: 1.31 - 2.62 ft/sec, indicative of limited community ambulator   General Gait Details: Short distance without AD, demonstrating LOB towards left requiring min assist to steady. Holding IV pole pt CGA without further episodes. Try SPC next visit vs RW. SpO2 dropped to 87% on RA, 94% on 1L supplemental O2. HR stable.   Stairs             Wheelchair Mobility     Tilt Bed    Modified Rankin (Stroke Patients Only)       Balance Overall balance assessment: Needs assistance Sitting-balance support: No upper extremity supported, Feet supported Sitting balance-Leahy Scale: Good     Standing balance support: No upper extremity supported Standing balance-Leahy Scale: Fair                   Standardized Balance Assessment Standardized Balance Assessment : Berg Balance Test Berg Balance Test Sit to Stand: Able to stand  independently using hands Standing Unsupported: Able to stand safely 2 minutes Sitting with Back Unsupported but Feet Supported on Floor or Stool: Able to sit 2 minutes under supervision Stand to Sit: Controls descent by using hands Transfers: Able to transfer safely, definite need of hands Standing Unsupported  with Eyes Closed: Able to stand 10 seconds safely Standing Ubsupported with Feet Together: Able to place feet together independently and stand 1 minute safely From Standing, Reach Forward with Outstretched Arm: Can reach forward >5 cm safely (2) From Standing Position, Pick up Object from Floor: Able to pick up shoe,  needs supervision From Standing Position, Turn to Look Behind Over each Shoulder: Looks behind from both sides and weight shifts well Turn 360 Degrees: Able to turn 360 degrees safely in 4 seconds or less Standing Unsupported, Alternately Place Feet on Step/Stool: Able to complete 4 steps without aid or supervision Standing Unsupported, One Foot in Front: Able to take small step independently and hold 30 seconds Standing on One Leg: Tries to lift leg/unable to hold 3 seconds but remains standing independently Total Score: 42        Communication Communication Communication: Impaired Factors Affecting Communication: Hearing impaired  Cognition Arousal: Alert Behavior During Therapy: WFL for tasks assessed/performed   PT - Cognitive impairments: No apparent impairments                         Following commands: Intact      Cueing Cueing Techniques: Verbal cues  Exercises General Exercises - Lower Extremity Ankle Circles/Pumps: AROM, Both, 10 reps, Seated Quad Sets: Strengthening, Both, 10 reps, Seated Gluteal Sets: Strengthening, Both, 10 reps, Seated Other Exercises Other Exercises: Encouraged IS use hourly.    General Comments General comments (skin integrity, edema, etc.): SpO2 90% at rest on RA, 87% while ambulating on RA. 94% on 1L while ambulating.      Pertinent Vitals/Pain Pain Assessment Pain Assessment: Faces Faces Pain Scale: Hurts little more Pain Location: L side of back Pain Descriptors / Indicators: Discomfort Pain Intervention(s): Monitored during session    Home Living Family/patient expects to be discharged to:: Private residence Living Arrangements: Alone Available Help at Discharge: Family;Available 24 hours/day Type of Home: House Home Access: Stairs to enter Entrance Stairs-Rails: None Entrance Stairs-Number of Steps: 2 Alternate Level Stairs-Number of Steps: 16 Home Layout: Two level;Bed/bath upstairs Home Equipment: Agricultural consultant  (2 wheels);Cane - single point      Prior Function            PT Goals (current goals can now be found in the care plan section) Acute Rehab PT Goals Patient Stated Goal: Regain IND and get off the oxygen PT Goal Formulation: With patient Time For Goal Achievement: 06/16/24 Potential to Achieve Goals: Good Progress towards PT goals: Progressing toward goals    Frequency    Min 3X/week      PT Plan      Co-evaluation              AM-PAC PT 6 Clicks Mobility   Outcome Measure  Help needed turning from your back to your side while in a flat bed without using bedrails?: None Help needed moving from lying on your back to sitting on the side of a flat bed without using bedrails?: None Help needed moving to and from a bed to a chair (including a wheelchair)?: A Little Help needed standing up from a chair using your arms (e.g., wheelchair or bedside chair)?: A Little Help needed to walk in hospital room?: A Little Help needed climbing 3-5 steps with a railing? : A Lot 6 Click Score: 19    End of Session Equipment Utilized During Treatment: Gait belt;Oxygen Activity Tolerance: Patient tolerated treatment well Patient left: with call  bell/phone within reach;with family/visitor present;in chair Nurse Communication: Mobility status PT Visit Diagnosis: Unsteadiness on feet (R26.81);Difficulty in walking, not elsewhere classified (R26.2);Other abnormalities of gait and mobility (R26.89)     Time: 1551-1620 PT Time Calculation (min) (ACUTE ONLY): 29 min  Charges:    $Gait Training: 8-22 mins $Therapeutic Activity: 8-22 mins PT General Charges $$ ACUTE PT VISIT: 1 Visit                     Leontine Roads, PT, DPT [facility]  Rehabilitation Services Physical Therapist Office: [phone] Website: [email or web address]    Leontine GORMAN Roads 06/05/2024, 4:36 PM

## 2024-06-05 NOTE — Progress Notes (Addendum)
 Advanced Heart Failure Rounding Note  Cardiologist: Dr. Delford ([place])  Chief Complaint: Large Pericardial Effusion w/ Tamponade   Patient Profile   83 y/o female w/ no prior cardiac history admitted to CCU for large pericardial effusion w/ tamponade, in setting of acute respiratory viral infection. S/p pericardicentesis 10/19 w/ removal of 770 cc of hemorrhagic fluid. Course c/b Afib w/ RVR.    Subjective:    Pericardial drain output 220 cc   Repeat limited echo pending   BP normotensive   Cytology pending. On colchicine    Back in NSR this morning, HR 80s. On amio gtt at 30/hr   BCx 1/4 grew staph epi (likely contaminant)   Sitting up in bed. Feels better. Dyspnea resolved. No pleuritic chest pain.    Objective:   Weight Range: 86.2 kg Body mass index is 33.66 kg/m.   Vital Signs:   Temp:  [97.5 F (36.4 C)-98.4 F (36.9 C)] 97.6 F (36.4 C) (10/20 0800) Pulse Rate:  [79-112] 87 (10/20 0800) Resp:  [13-31] 21 (10/20 0800) BP: (96-134)/(30-76) 121/64 (10/20 0800) SpO2:  [91 %-100 %] 94 % (10/20 0800) Weight:  [86.2 kg] 86.2 kg (10/20 0600) Last BM Date : 06/02/24  Weight change: Filed Weights   06/02/24 1251 06/04/24 0447 06/05/24 0600  Weight: 77 kg 86.6 kg 86.2 kg    Intake/Output:   Intake/Output Summary (Last 24 hours) at 06/05/2024 0917 Last data filed at 06/05/2024 0800 Gross per 24 hour  Intake 581.7 ml  Output 1070 ml  Net -488.3 ml      Physical Exam   Vitals:   06/05/24 0745 06/05/24 0800  BP:  121/64  Pulse: 85 87  Resp: 20 (!) 21  Temp:  97.6 F (36.4 C)  SpO2: 95% 94%   GENERAL: NAD Lungs- clear  CARDIAC:  JVP 6-7 cm         Distant heart sounds. Normal rate with regular rhythm + subxiphoid pericardial drain  ABDOMEN: Soft, non-tender, non-distended.  EXTREMITIES: Warm and well perfused.  NEUROLOGIC: No obvious FND   Telemetry   NSR 80s, personally reviewed   EKG    Afib 120 bpm, personally reviewed   Labs     CBC Recent Labs    06/02/24 1305 06/02/24 1338 06/03/24 0438 06/04/24 0439  WBC 10.8*  --  8.5 7.6  NEUTROABS 8.3*  --   --   --   HGB 12.4   < > 11.9* 11.5*  HCT 42.0   < > 39.7 38.7  MCV 86.4  --  88.8 87.2  PLT 303  --  256 272   < > = values in this interval not displayed.   Basic Metabolic Panel Recent Labs    89/81/74 0438 06/04/24 0439 06/05/24 0238  NA 139 137 138  K 4.5 4.4 3.6  CL 103 101 104  CO2 22 22 23   GLUCOSE 142* 143* 114*  BUN 17 23 18   CREATININE 0.93 1.24* 0.97  CALCIUM  9.3 9.5 8.5*  MG 1.8  --  1.6*   Liver Function Tests Recent Labs    06/02/24 1305 06/04/24 1829  AST 18  --   ALT 18  --   ALKPHOS 118  --   BILITOT 1.6*  --   PROT 6.7  --   ALBUMIN 4.1 3.0*   Recent Labs    06/02/24 1305  LIPASE 12   Cardiac Enzymes Recent Labs    06/02/24 2051  CKTOTAL 63  BNP: BNP (last 3 results) No results for input(s): BNP in the last 8760 hours.  ProBNP (last 3 results) Recent Labs    06/02/24 1305  PROBNP 268.0     D-Dimer No results for input(s): DDIMER in the last 72 hours. Hemoglobin A1C No results for input(s): HGBA1C in the last 72 hours. Fasting Lipid Panel No results for input(s): CHOL, HDL, LDLCALC, TRIG, CHOLHDL, LDLDIRECT in the last 72 hours. Thyroid  Function Tests No results for input(s): TSH, T4TOTAL, T3FREE, THYROIDAB in the last 72 hours.  Invalid input(s): FREET3  Other results:   Imaging    CARDIAC CATHETERIZATION Result Date: 06/04/2024 Successful pericardiocentesis with removal of 770 cc of hemorrhagic fluid Plan: drain left in place. Will get limited Echo in am. Fluid sent for analysis.     Medications:     Scheduled Medications:  aspirin  EC  81 mg Oral Daily   Chlorhexidine Gluconate Cloth  6 each Topical Daily   cholecalciferol  3,000 Units Oral Daily   cinacalcet  60 mg Oral QHS   colchicine  0.6 mg Oral BID   enoxaparin  (LOVENOX ) injection  40 mg  Subcutaneous QHS   famotidine  20 mg Oral Daily   furosemide   40 mg Intravenous Once   rosuvastatin  20 mg Oral QPM   senna-docusate  1 tablet Oral Daily    Infusions:  amiodarone 30 mg/hr (06/05/24 0700)    PRN Medications: acetaminophen  **OR** acetaminophen , acetaminophen , fluticasone , guaiFENesin-dextromethorphan, hydrALAZINE, ipratropium-albuterol, ondansetron  **OR** ondansetron  (ZOFRAN ) IV     Assessment/Plan   1. Larger Pericardial Effusion w/ Tamponade - s/p pericardicentesis 10/19, removal of 770 cc of hemorrhagic fluid - hemodynamics stable  - pericardial drain output>>220 cc - suspect 2/2 viral pericarditis in setting of acute respiratory viral infection  - cytology pending  - continue colchicine - obtain repeat limited echo today   - keep drain until output < 50cc/ 24 hr   2. Afib w/ RVR - in setting of acute infection/pericardial effusion  - back in NSR  - on amiodarone gtt  - no a/c currently given hemorrhagic effusion  - will need outpatient surveillance w/ Zio to monitor for recurrence  - give Mg supp (1.6)   3. Coronavirus NL63  - supportive care per CCM   CRITICAL CARE Performed by: Caffie Shed   Total critical care time: 15 minutes  Critical care time was exclusive of separately billable procedures and treating other patients.  Critical care was necessary to treat or prevent imminent or life-threatening deterioration.  Critical care was time spent personally by me on the following activities: development of treatment plan with patient and/or surrogate as well as nursing, discussions with consultants, evaluation of patient's response to treatment, examination of patient, obtaining history from patient or surrogate, ordering and performing treatments and interventions, ordering and review of laboratory studies, ordering and review of radiographic studies, pulse oximetry and re-evaluation of patient's condition.    Length of Stay:  [address] Shed RIGGERS  06/05/2024, 9:17 AM  Advanced Heart Failure Team Pager [phone] (M-F; 7a - 5p)  Please contact [facility] for night-coverage after hours (5p -7a ) and weekends on [email or web address]  Patient seen with PA, I formulated the plan and agree with the above note.   Pericardial drain in place, she had another 200 cc out overnight.    She is back in NSR today on amiodarone gtt. Not anticoagulated yet.   I reviewed echo at bedside, there was a small residual pericardial effusion without  evidence for tamponade.  LV EF 55-60%.  The RV was mildly dilated with mild-moderate systolic dysfunction, there was regional akinesis of the mid RV free wall.   General: NAD Neck: No JVD, no thyromegaly or thyroid  nodule.  Lungs: Clear to auscultation bilaterally with normal respiratory effort. CV: Nondisplaced PMI.  Heart regular S1/S2, no S3/S4, no murmur.  No peripheral edema.    Abdomen: Soft, nontender, no hepatosplenomegaly, no distention.  Skin: Intact without lesions or rashes.  Neurologic: Alert and oriented x 3.  Psych: Normal affect. Extremities: No clubbing or cyanosis.  HEENT: Normal.   1. Pericardial tamponade: Patient has had URI symptoms and had a non-COVID-19 coronavirus on respiratory viral cultures.  Suspect acute viral pericarditis as trigger for tamponade.  S/p pericardiocentesis with pericardial drain in place.  200 cc out over last day.  Echo today with small residual effusion, no tamponade.  - Pending cytology.  - Will leave drain in today, pull when < 50 cc drainage/24 hrs.  - Continue colchicine 0.6 bid.   2. RV dysfunction: Echo today was reviewed and showed mild-moderate RV systolic dysfunction with akinesis of the mid RV free wall, mild RV dilation.  CTA chest at admission was negative for PE.  ?Focal myocarditis but troponin negative on 10/17.  - When drain is out, will arrange for cardiac MRI.  - Repeat troponin.  3. Atrial fibrillation: With RVR, in  setting of acute pericarditis with pericardial effusion. Now back in NSR on amiodarone gtt.   - Continue amiodarone for now.  - Would hold off on anticoagulation for the time being, will need eventually.   CRITICAL CARE Performed by: Ezra Shuck  Total critical care time: 40 minutes  Critical care time was exclusive of separately billable procedures and treating other patients.  Critical care was necessary to treat or prevent imminent or life-threatening deterioration.  Critical care was time spent personally by me on the following activities: development of treatment plan with patient and/or surrogate as well as nursing, discussions with consultants, evaluation of patient's response to treatment, examination of patient, obtaining history from patient or surrogate, ordering and performing treatments and interventions, ordering and review of laboratory studies, ordering and review of radiographic studies, pulse oximetry and re-evaluation of patient's condition.  Ezra Shuck 06/05/2024 11:18 AM

## 2024-06-05 NOTE — Progress Notes (Signed)
 NAME:  Katherine Bennett, MRN:  [id number], DOB:  [birth year], LOS: 3 ADMISSION DATE:  06/02/2024, CONSULTATION DATE:  06/04/2024 REFERRING MD:  Lue Fallow , CHIEF COMPLAINT: Shortness of breath  History of Present Illness:  83 year old female with hypertension, hyperlipidemia and irritable bowel syndrome who initially presented on 10/17 with increasing shortness of breath and cough for [facility], on workup she was noted to have coronavirus NL 63 positive upon admission, she was admitted under hospitalist care with acute hypoxic respiratory failure in the setting of bilateral multifocal pneumonia with pleural effusion.  CT angiogram of chest was done which showed bilateral pleural effusion right more than left, also consistent with large pericardial effusion.  Echocardiogram was done today showing large pericardial effusion with cardiac tamponade physiology even though patient's blood pressure is stable.  Patient was transferred to [facility], underwent pericardiocentesis and pericardial drain in place, 770 cc of hemorrhagic fluid was drained.  Patient was admitted to ICU for close monitoring, PCCM was consulted for help evaluation medical management. After procedure patient stated breathing is much better, denies chest pain, nausea, vomiting or fever  Pertinent  Medical History   Past Medical History:  Diagnosis Date   Erb's palsy    History of squamous cell carcinoma    followed by dermatology   Hyperlipidemia    Hypertension    IBS (irritable bowel syndrome)    Obesity    Osteoporosis 2012     Significant Hospital Events: Including procedures, antibiotic start and stop dates in addition to other pertinent events   10/19 transferred from [facility] after being diagnosed with large pericardial effusion, underwent pericardiocentesis and drain placement  Interim History / Subjective:  Patient stated feeling much better, oxygen requirement went  down from 10 L to 2 L Denied chest pain Remained afebrile  Objective    Blood pressure 124/62, pulse 93, temperature 98.4 F (36.9 C), temperature source Oral, resp. rate (!) 23, height 5' 3 (1.6 m), weight 86.2 kg, SpO2 91%.        Intake/Output Summary (Last 24 hours) at 06/05/2024 0745 Last data filed at 06/05/2024 0700 Gross per 24 hour  Intake 521.7 ml  Output 1070 ml  Net -548.3 ml   Filed Weights   06/02/24 1251 06/04/24 0447 06/05/24 0600  Weight: 77 kg 86.6 kg 86.2 kg   Physical exam: General: Elderly obese female, lying on the bed HEENT: [place]/AT, eyes anicteric.  moist mucus membranes Neuro: Alert, awake following commands Chest: Diminished air entry at the bases bilaterally, right more than left, no wheezes or rhonchi Heart: Regular rate and rhythm, no murmurs or gallops Abdomen: Soft, nontender, nondistended, bowel sounds present  Labs reviewed  Patient Lines/Drains/Airways Status     Active Line/Drains/Airways     Name Placement date Placement time Site Days   Peripheral IV 06/02/24 22 G 1 Left;Anterior Forearm 06/02/24  1942  Forearm  3   Closed System Drain Inferior;Medial Pericardial Accordion (Hemovac) 06/04/24  1400  Pericardial  1        Pericardial drain output 220 cc since drain placement  Resolved problem list   Assessment and Plan  Acute respiratory failure with hypoxia in the setting of coronavirus infection, POA Bilateral pleural effusion Large pericardial effusion with cardiac tamponade physiology status post pericardiocentesis and pericardial drain in place Paroxysmal A-fib in the setting of large pericardial effusion, converted to sinus rhythm Hypertension Hyperlipidemia Acute kidney injury due to cardiorenal syndrome, improving Obesity  Continue titrate nasal cannula oxygen, currently on 2 L down from 10 L yesterday with O2 sat in mid 90s Continue to titrate with O2 sat goal 88% Continue droplet precautions Will give her Lasix  x  1 to improve pleural effusion Closely monitor drain output Cardiology is following Remain in sinus rhythm, continue amiodarone Avoid anticoagulation Continue aspirin  Will resume Crestor Hold antihypertensive meds Serum creatinine trended down to 0.9, monitor intake and output Avoid nephrotoxic agent Diet and exercise counseling provided   Labs   CBC: Recent Labs  Lab 06/02/24 1305 06/02/24 1338 06/03/24 0438 06/04/24 0439  WBC 10.8*  --  8.5 7.6  NEUTROABS 8.3*  --   --   --   HGB 12.4 13.6 11.9* 11.5*  HCT 42.0 40.0 39.7 38.7  MCV 86.4  --  88.8 87.2  PLT 303  --  256 272    Basic Metabolic Panel: Recent Labs  Lab 06/02/24 1305 06/02/24 1338 06/03/24 0438 06/04/24 0439 06/05/24 0238  NA 138 140 139 137 138  K 4.0 4.0 4.5 4.4 3.6  CL 103 103 103 101 104  CO2 22  --  22 22 23   GLUCOSE 160* 160* 142* 143* 114*  BUN 16 16 17 23 18   CREATININE 0.89 1.00 0.93 1.24* 0.97  CALCIUM  9.3  --  9.3 9.5 8.5*  MG  --   --  1.8  --  1.6*   GFR: Estimated Creatinine Clearance: 44.1 mL/min (by C-G formula based on SCr of 0.97 mg/dL). Recent Labs  Lab 06/02/24 1305 06/02/24 1337 06/02/24 1639 06/03/24 0438 06/04/24 0439  WBC 10.8*  --   --  8.5 7.6  LATICACIDVEN  --  1.2 1.7  --   --     Liver Function Tests: Recent Labs  Lab 06/02/24 1305 06/04/24 1829  AST 18  --   ALT 18  --   ALKPHOS 118  --   BILITOT 1.6*  --   PROT 6.7  --   ALBUMIN 4.1 3.0*   Recent Labs  Lab 06/02/24 1305  LIPASE 12   No results for input(s): AMMONIA in the last 168 hours.  ABG    Component Value Date/Time   HCO3 27.2 06/02/2024 1305   TCO2 22 06/02/2024 1338   O2SAT 72.3 06/02/2024 1305     Coagulation Profile: No results for input(s): INR, PROTIME in the last 168 hours.  Cardiac Enzymes: Recent Labs  Lab 06/02/24 2051  CKTOTAL 63    HbA1C: No results found for: HGBA1C  CBG: No results for input(s): GLUCAP in the last 168 hours.    Valinda Novas,  MD [place] [facility] See Amion for pager If no response to pager, please call [phone] until 7pm After 7pm, Please call E-link [phone]

## 2024-06-06 ENCOUNTER — Ambulatory Visit: Payer: Self-pay | Admitting: Physician Assistant

## 2024-06-06 DIAGNOSIS — I2781 Cor pulmonale (chronic): Secondary | ICD-10-CM

## 2024-06-06 DIAGNOSIS — I314 Cardiac tamponade: Secondary | ICD-10-CM | POA: Diagnosis not present

## 2024-06-06 LAB — BASIC METABOLIC PANEL WITH GFR
Anion gap: 8 (ref 5–15)
BUN: 14 mg/dL (ref 8–23)
CO2: 26 mmol/L (ref 22–32)
Calcium: 8 mg/dL — ABNORMAL LOW (ref 8.9–10.3)
Chloride: 105 mmol/L (ref 98–111)
Creatinine, Ser: 0.82 mg/dL (ref 0.44–1.00)
GFR, Estimated: >60 mL/min (ref >60)
Glucose, Bld: 117 mg/dL — ABNORMAL HIGH (ref 70–99)
Potassium: 3.4 mmol/L — ABNORMAL LOW (ref 3.5–5.1)
Sodium: 139 mmol/L (ref 135–145)

## 2024-06-06 LAB — CBC
HCT: 38 % (ref 36.0–46.0)
Hemoglobin: 11.8 g/dL — ABNORMAL LOW (ref 12.0–15.0)
MCH: 26.4 pg (ref 26.0–34.0)
MCHC: 31.1 g/dL (ref 30.0–36.0)
MCV: 85 fL (ref 80.0–100.0)
Platelets: 219 K/uL (ref 150–400)
RBC: 4.47 MIL/uL (ref 3.87–5.11)
RDW: 13.6 % (ref 11.5–15.5)
WBC: 5.5 K/uL (ref 4.0–10.5)
nRBC: 0 % (ref 0.0–0.2)

## 2024-06-06 LAB — PROTEIN, BODY FLUID (OTHER): Total Protein, Body Fluid Other: 16.7 g/dL

## 2024-06-06 LAB — MRSA NEXT GEN BY PCR, NASAL: MRSA by PCR Next Gen: NOT DETECTED

## 2024-06-06 LAB — CYTOLOGY - NON PAP

## 2024-06-06 MED ORDER — LACTULOSE 10 GM/15ML PO SOLN
10.0000 g | Freq: Once | ORAL | Status: AC
  Administered 2024-06-06: 10 g via ORAL
  Filled 2024-06-06: qty 15

## 2024-06-06 MED ORDER — FUROSEMIDE 10 MG/ML IJ SOLN
40.0000 mg | Freq: Once | INTRAMUSCULAR | Status: AC
  Administered 2024-06-06: 40 mg via INTRAVENOUS
  Filled 2024-06-06: qty 4

## 2024-06-06 MED ORDER — POTASSIUM CHLORIDE CRYS ER 20 MEQ PO TBCR
40.0000 meq | EXTENDED_RELEASE_TABLET | Freq: Two times a day (BID) | ORAL | Status: AC
  Administered 2024-06-06: 40 meq via ORAL
  Filled 2024-06-06: qty 2

## 2024-06-06 MED ORDER — HYALURONIDASE HUMAN 150 UNIT/ML IJ SOLN
150.0000 [IU] | Freq: Once | INTRAMUSCULAR | Status: AC
  Administered 2024-06-06: 150 [IU] via SUBCUTANEOUS
  Filled 2024-06-06: qty 1

## 2024-06-06 MED ORDER — POTASSIUM CHLORIDE CRYS ER 20 MEQ PO TBCR
40.0000 meq | EXTENDED_RELEASE_TABLET | Freq: Once | ORAL | Status: AC
  Administered 2024-06-06: 40 meq via ORAL
  Filled 2024-06-06: qty 2

## 2024-06-06 MED ORDER — POLYETHYLENE GLYCOL 3350 17 G PO PACK
17.0000 g | PACK | Freq: Every day | ORAL | Status: DC
  Administered 2024-06-06 – 2024-06-08 (×2): 17 g via ORAL
  Filled 2024-06-06 (×3): qty 1

## 2024-06-06 NOTE — Progress Notes (Signed)
 NAME:  Katherine Bennett, MRN:  [id number], DOB:  Jun 03, 1941, LOS: 4 ADMISSION DATE:  06/02/2024, CONSULTATION DATE:  06/04/2024 REFERRING MD:  Lue Fallow , CHIEF COMPLAINT: Shortness of breath  History of Present Illness:  83 year old female with hypertension, hyperlipidemia and irritable bowel syndrome who initially presented on 10/17 with increasing shortness of breath and cough [facility], on workup she was noted to have coronavirus NL 63 positive upon admission, she was admitted under hospitalist care with acute hypoxic respiratory failure in the setting of bilateral multifocal pneumonia with pleural effusion.  CT angiogram of chest was done which showed bilateral pleural effusion right more than left, also consistent with large pericardial effusion.  Echocardiogram was done today showing large pericardial effusion with cardiac tamponade physiology even though patient's blood pressure is stable.  Patient was transferred to [facility], underwent pericardiocentesis and pericardial drain in place, 770 cc of hemorrhagic fluid was drained.  Patient was admitted to ICU for close monitoring, PCCM was consulted for help evaluation medical management. After procedure patient stated breathing is much better, denies chest pain, nausea, vomiting or fever  Pertinent  Medical History   Past Medical History:  Diagnosis Date   Erb's palsy    History of squamous cell carcinoma    followed by dermatology   Hyperlipidemia    Hypertension    IBS (irritable bowel syndrome)    Obesity    Osteoporosis 2012     Significant Hospital Events: Including procedures, antibiotic start and stop dates in addition to other pertinent events   10/19 transferred from [facility] after being diagnosed with large pericardial effusion, underwent pericardiocentesis and drain placement 10/20 stated feeling better, oxygen requirement is coming down, currently on 2 L.  Denies chest  pain.  Pericardial drain output 2.3 cc in last 24 hours  Interim History / Subjective:  No overnight issues, remained afebrile Drain output over 135 cc in last 24 hours She is up in the recliner  Objective    Blood pressure (!) 124/59, pulse 81, temperature 97.6 F (36.4 C), temperature source Oral, resp. rate 16, height 5' 3 (1.6 m), weight 86.2 kg, SpO2 98%.        Intake/Output Summary (Last 24 hours) at 06/06/2024 0809 Last data filed at 06/06/2024 0800 Gross per 24 hour  Intake 860.14 ml  Output 2335 ml  Net -1474.86 ml   Filed Weights   06/04/24 0447 06/05/24 0600 06/06/24 0500  Weight: 86.6 kg 86.2 kg 86.2 kg   Physical exam: General: Obese female, sitting on recliner HEENT: [place]/AT, eyes anicteric.  moist mucus membranes Neuro: Alert, awake following commands Chest: Air entry at the bases right more than left, otherwise clear, no wheezes or rhonchi Heart: Regular rate and rhythm, no murmurs or gallops Abdomen: Soft, nontender, nondistended, bowel sounds present  Labs reviewed  Patient Lines/Drains/Airways Status     Active Line/Drains/Airways     Name Placement date Placement time Site Days   Peripheral IV 06/02/24 22 G 1 Left;Anterior Forearm 06/02/24  1942  Forearm  3   Closed System Drain Inferior;Medial Pericardial Accordion (Hemovac) 06/04/24  1400  Pericardial  1        Pericardial drain output 135 cc since drain placement  Resolved problem list   Assessment and Plan  Acute respiratory failure with hypoxia in the setting of coronavirus infection, POA Bilateral pleural effusion Large pericardial effusion with cardiac tamponade physiology status post pericardiocentesis and pericardial drain in place  Acute cor pulmonale Paroxysmal A-fib in the setting of large pericardial effusion, converted to sinus rhythm Hypertension Hyperlipidemia Acute kidney injury due to cardiorenal syndrome, resolved Obesity  Patient stated breathing is better, currently  on 2 L nasal cannula oxygen, encourage incentive spirometry and ambulation, titrate oxygen with O2 sat goal 90% Continue droplet precautions She received Lasix  40 mg yesterday with good urine output and improvement in serum creatinine Will give her 40 mg again today Pericardial drain output was 135 cc in last 24 hours, per cardiology it will be taken out drain output is less than 50 cc in 24 hours Repeat echocardiogram showed moderate RV dysfunction and dilatation, PE was ruled out Patient will need cardiac MRI once clinically better Remain in sinus rhythm, on amiodarone Continue aspirin  and Crestor Continue to hold antihypertensive Serum creatinine is back to baseline Diet and exercise counseling provided   Labs   CBC: Recent Labs  Lab 06/02/24 1305 06/02/24 1338 06/03/24 0438 06/04/24 0439 06/05/24 0912 06/06/24 0354  WBC 10.8*  --  8.5 7.6 6.0 5.5  NEUTROABS 8.3*  --   --   --   --   --   HGB 12.4 13.6 11.9* 11.5* 12.1 11.8*  HCT 42.0 40.0 39.7 38.7 38.8 38.0  MCV 86.4  --  88.8 87.2 84.5 85.0  PLT 303  --  256 272 230 219    Basic Metabolic Panel: Recent Labs  Lab 06/03/24 0438 06/04/24 0439 06/05/24 0238 06/05/24 2022 06/06/24 0354  NA 139 137 138 139 139  K 4.5 4.4 3.6 4.1 3.4*  CL 103 101 104 103 105  CO2 22 22 23 26 26   GLUCOSE 142* 143* 114* 146* 117*  BUN 17 23 18 15 14   CREATININE 0.93 1.24* 0.97 1.00 0.82  CALCIUM  9.3 9.5 8.5* 8.3* 8.0*  MG 1.8  --  1.6* 2.0  --    GFR: Estimated Creatinine Clearance: 52.2 mL/min (by C-G formula based on SCr of 0.82 mg/dL). Recent Labs  Lab 06/02/24 1337 06/02/24 1639 06/03/24 0438 06/04/24 0439 06/05/24 0912 06/06/24 0354  WBC  --   --  8.5 7.6 6.0 5.5  LATICACIDVEN 1.2 1.7  --   --   --   --     Liver Function Tests: Recent Labs  Lab 06/02/24 1305 06/04/24 1829  AST 18  --   ALT 18  --   ALKPHOS 118  --   BILITOT 1.6*  --   PROT 6.7  --   ALBUMIN 4.1 3.0*   Recent Labs  Lab 06/02/24 1305   LIPASE 12   No results for input(s): AMMONIA in the last 168 hours.  ABG    Component Value Date/Time   HCO3 27.2 06/02/2024 1305   TCO2 22 06/02/2024 1338   O2SAT 72.3 06/02/2024 1305     Coagulation Profile: No results for input(s): INR, PROTIME in the last 168 hours.  Cardiac Enzymes: Recent Labs  Lab 06/02/24 2051  CKTOTAL 63    HbA1C: No results found for: HGBA1C  CBG: No results for input(s): GLUCAP in the last 168 hours.    Valinda Novas, MD [place] [facility] See Amion for pager If no response to pager, please call [phone] until 7pm After 7pm, Please call E-link [phone]

## 2024-06-06 NOTE — Progress Notes (Signed)
 [facility] ADULT ICU REPLACEMENT PROTOCOL   The patient does apply for the [facility] Adult ICU Electrolyte Replacment Protocol based on the criteria listed below:   1.Exclusion criteria: TCTS, ECMO, Dialysis, and Myasthenia Gravis patients 2. Is GFR >/= 30 ml/min? Yes.    Patient's GFR today is >60 3. Is SCr </= 2? Yes.   Patient's SCr is 0.82 mg/dL 4. Did SCr increase >/= 0.5 in 24 hours? No. 5.Pt's weight >40kg  Yes.   6. Abnormal electrolyte(s): K+ 3.4  7. Electrolytes replaced per protocol 8.  Call MD STAT for K+ </= 2.5, Phos </= 1, or Mag </= 1 Physician:  Uvaldo Henry Recardo LELON 06/06/2024 5:28 AM

## 2024-06-06 NOTE — TOC Initial Note (Signed)
 Transition of Care [facility]) - Initial/Assessment Note    Patient Details  Name: Katherine Bennett MRN: [id number] Date of Birth: 23-May-1941  Transition of Care [facility]) CM/SW Contact:    Justina Delcia Czar, RN Phone Number: [phone] 06/06/2024, 2:39 PM  Clinical Narrative:                 Spoke to pt and dtr, Joen at bedside. Pt states she lives independently at home. Dtr states pt has no DME in the home. Dtrs will be in home to stay with patient when she dc. May need oxygen for home or HHPT.   Goal is for patient to move closer to dtr in [place].  Will continue to follow for dc needs.    Expected Discharge Plan: Home w Home Health Services Barriers to Discharge: Continued Medical Work up   Patient Goals and CMS Choice Patient states their goals for this hospitalization and ongoing recovery are:: wants to get better          Expected Discharge Plan and Services   Discharge Planning Services: CM Consult   Living arrangements for the past 2 months: Single Family Home                                      Prior Living Arrangements/Services Living arrangements for the past 2 months: Single Family Home Lives with:: Self Patient language and need for interpreter reviewed:: Yes Do you feel safe going back to the place where you live?: Yes      Need for Family Participation in Patient Care: No (Comment) Care giver support system in place?: Yes (comment) Current home services: DME (scale) Criminal Activity/Legal Involvement Pertinent to Current Situation/Hospitalization: No - Comment as needed  Activities of Daily Living   ADL Screening (condition at time of admission) Independently performs ADLs?: Yes (appropriate for developmental age) Is the patient deaf or have difficulty hearing?: Yes Does the patient have difficulty seeing, even when wearing glasses/contacts?: No Does the patient have difficulty concentrating, remembering, or making decisions?: No  Permission  Sought/Granted Permission sought to share information with : Case Manager, Family Supports, PCP Permission granted to share information with : Yes, Verbal Permission Granted  Share Information with NAME: Sheri Ward  Permission granted to share info w AGENCY: Home Health, PCP, DME  Permission granted to share info w Relationship: daughter  Permission granted to share info w Contact Information: [phone]  Emotional Assessment Appearance:: Appears stated age Attitude/Demeanor/Rapport: Engaged Affect (typically observed): Accepting Orientation: : Oriented to Self, Oriented to Place, Oriented to  Time, Oriented to Situation   Psych Involvement: No (comment)  Admission diagnosis:  Pneumonia [J18.9] Acute respiratory failure with hypoxia (HCC) [J96.01] Community acquired pneumonia of left lung, unspecified part of lung [J18.9] Patient Active Problem List   Diagnosis Date Noted   Acute respiratory failure with hypoxia (HCC) 06/04/2024   Pericardial tamponade 06/04/2024   Pneumonia 06/02/2024   Encephalopathy acute 12/14/2016   Acute lower UTI 12/14/2016   Hyperglycemia 12/14/2016   Malnutrition 12/14/2016   Elevated LFTs 12/14/2016   Osteoporosis 07/01/2015   Essential hypertension, benign 05/09/2015   Vitamin D  deficiency 05/09/2015   Hyperparathyroidism 05/09/2015   Hypercalcemia 05/09/2015   PCP:  Joneen Nest, MD Pharmacy:   [facility] [id number] - [place], [place] - [address] [place] [address] Phone: [phone] Fax: [phone]  Social Drivers of Health (SDOH) Social History: SDOH Screenings   Food Insecurity: No Food Insecurity (06/02/2024)  Housing: Low Risk  (06/02/2024)  Transportation Needs: No Transportation Needs (06/02/2024)  Utilities: Not At Risk (06/02/2024)  Financial Resource Strain: Low Risk  (03/11/2022)   Received from [facility] visits prior to 10/17/2022.,  [facility]  Physical Activity: Patient Declined (03/11/2022)   Received from [facility]  Social Connections: Unknown (06/02/2024)  Stress: Stress Concern Present (03/11/2022)   Received from [facility] visits prior to 10/17/2022., [facility]  Tobacco Use: Medium Risk (06/02/2024)   SDOH Interventions:     Readmission Risk Interventions     No data to display

## 2024-06-06 NOTE — Progress Notes (Addendum)
 Advanced Heart Failure Rounding Note  Cardiologist: Dr. Delford (New)  Chief Complaint: Large Pericardial Effusion w/ Tamponade   Patient Profile   83 y/o female w/ no prior cardiac history admitted to CCU for large pericardial effusion w/ tamponade, in setting of acute respiratory viral infection. S/p pericardicentesis 10/19 w/ removal of 770 cc of hemorrhagic fluid. Course c/b Afib w/ RVR.    Subjective:    Limited Echo 10/20: small pericardial effusion surrounding the apex, localized near the right ventricle and posterior to the left ventricle. No tamponade. LVEF 55-60%, RV mod reduced w/ AK of right ventricular free wall  BP normotensive  Pericardial drain output past 24 hr = 135 cc   Cytology pending. On colchicine. Denies pleuritic CP. No dyspnea.     Maintaining NSR. On amio gtt at 30/hr   Good UOP w/ IV Lasix  yesterday, 2.3L. SCr stable. K 3.4   BCx 1/4 grew staph epi (likely contaminant)     Objective:   Weight Range: 86.2 kg Body mass index is 33.66 kg/m.   Vital Signs:   Temp:  [97.6 F (36.4 C)-98 F (36.7 C)] 97.6 F (36.4 C) (10/21 0800) Pulse Rate:  [78-111] 81 (10/21 0800) Resp:  [15-30] 16 (10/21 0800) BP: (82-134)/(52-85) 124/59 (10/21 0800) SpO2:  [83 %-98 %] 98 % (10/21 0800) Weight:  [86.2 kg] 86.2 kg (10/21 0500) Last BM Date : 06/02/24  Weight change: Filed Weights   06/04/24 0447 06/05/24 0600 06/06/24 0500  Weight: 86.6 kg 86.2 kg 86.2 kg    Intake/Output:   Intake/Output Summary (Last 24 hours) at 06/06/2024 0803 Last data filed at 06/06/2024 0800 Gross per 24 hour  Intake 860.14 ml  Output 2335 ml  Net -1474.86 ml      Physical Exam   GENERAL: Sitting up in chair. NAD Lungs- clear CARDIAC:  JVP not elevated           Normal rate with regular rhythm. No MRG. No LEE + subxiphoid pericardial drain  ABDOMEN: Soft, non-tender, non-distended.  EXTREMITIES: Warm and well perfused.  NEUROLOGIC: No obvious FND   Telemetry    NSR 80s, personally reviewed   EKG    N/A   Labs    CBC Recent Labs    06/05/24 0912 06/06/24 0354  WBC 6.0 5.5  HGB 12.1 11.8*  HCT 38.8 38.0  MCV 84.5 85.0  PLT 230 219   Basic Metabolic Panel Recent Labs    89/79/74 0238 06/05/24 2022 06/06/24 0354  NA 138 139 139  K 3.6 4.1 3.4*  CL 104 103 105  CO2 23 26 26   GLUCOSE 114* 146* 117*  BUN 18 15 14   CREATININE 0.97 1.00 0.82  CALCIUM  8.5* 8.3* 8.0*  MG 1.6* 2.0  --    Liver Function Tests Recent Labs    06/04/24 1829  ALBUMIN 3.0*   No results for input(s): LIPASE, AMYLASE in the last 72 hours.  Cardiac Enzymes No results for input(s): CKTOTAL, CKMB, CKMBINDEX, TROPONINI in the last 72 hours.   BNP: BNP (last 3 results) No results for input(s): BNP in the last 8760 hours.  ProBNP (last 3 results) Recent Labs    06/02/24 1305  PROBNP 268.0     D-Dimer No results for input(s): DDIMER in the last 72 hours. Hemoglobin A1C No results for input(s): HGBA1C in the last 72 hours. Fasting Lipid Panel No results for input(s): CHOL, HDL, LDLCALC, TRIG, CHOLHDL, LDLDIRECT in the last 72 hours. Thyroid  Function Tests  No results for input(s): TSH, T4TOTAL, T3FREE, THYROIDAB in the last 72 hours.  Invalid input(s): FREET3  Other results:   Imaging    ECHOCARDIOGRAM LIMITED Result Date: 06/05/2024    ECHOCARDIOGRAM LIMITED REPORT   Patient Name:   Katherine Bennett Date of Exam: 06/05/2024 Medical Rec #:  [id number]         Height:       63.0 in Accession #:    [id number]        Weight:       190.0 lb Date of Birth:  1941/04/23         BSA:          1.892 m Patient Age:    83 years          BP:           121/64 mmHg Patient Gender: F                 HR:           88 bpm. Exam Location:  Inpatient Procedure: Limited Echo and Limited Color Doppler (Both Spectral and Color Flow            Doppler were utilized during procedure). Indications:    Pericardial effusion  I31.3  History:        Patient has prior history of Echocardiogram examinations, most                 recent 06/04/2024. Risk Factors:Hypertension.  Sonographer:    Thea Norlander RCS Referring Phys: [phone] PETER M SWAZILAND IMPRESSIONS  1. Left ventricular ejection fraction, by estimation, is 55 to 60%. The left ventricle has normal function. The left ventricle has no regional wall motion abnormalities. There is the interventricular septum is flattened in systole and diastole, consistent with right ventricular pressure and volume overload.  2. Akinesis of the right ventricular free wall with intact motion of the annulus and apex. Right ventricular systolic function is moderately reduced. The right ventricular size is moderately enlarged.  3. A small pericardial effusion is present. The pericardial effusion is surrounding the apex, localized near the right ventricle and posterior to the left ventricle. There is no evidence of cardiac tamponade. Moderate pleural effusion.  4. The aortic valve is tricuspid. Aortic valve sclerosis is present, with no evidence of aortic valve stenosis.  5. The inferior vena cava is normal in size with greater than 50% respiratory variability, suggesting right atrial pressure of 3 mmHg. Comparison(s): A prior study was performed on 06/04/2024. There was a large pericardial effusion with tamponade present. The ejection fraction was 60-65%. Right ventricular function was normal. FINDINGS  Left Ventricle: Left ventricular ejection fraction, by estimation, is 55 to 60%. The left ventricle has normal function. The left ventricle has no regional wall motion abnormalities. The interventricular septum is flattened in systole and diastole, consistent with right ventricular pressure and volume overload. Right Ventricle: Akinesis of the right ventricular free wall with intact motion of the annulus and apex. The right ventricular size is moderately enlarged. Right ventricular systolic function is  moderately reduced. Pericardium: No obvious signs of tamponade present. A small pericardial effusion is present. The pericardial effusion is surrounding the apex, localized near the right ventricle and posterior to the left ventricle. The pericardial effusion appears to contain fibrous material. There is no evidence of cardiac tamponade. Tricuspid Valve: Tricuspid valve regurgitation is mild. Aortic Valve: The aortic valve is tricuspid. Aortic valve sclerosis is present, with no evidence  of aortic valve stenosis. Venous: The inferior vena cava is normal in size with greater than 50% respiratory variability, suggesting right atrial pressure of 3 mmHg. Additional Comments: There is a moderate pleural effusion.  LEFT VENTRICLE PLAX 2D LVIDd:         4.10 cm LVIDs:         2.70 cm LV PW:         1.10 cm LV IVS:        0.90 cm  RIGHT VENTRICLE            IVC RV S prime:     8.21 cm/s  IVC diam: 1.40 cm TAPSE (M-mode): 1.2 cm LEFT ATRIUM         Index LA diam:    2.70 cm 1.43 cm/m Emeline Calender Electronically signed by Emeline Calender Signature Date/Time: 06/05/2024/3:41:10 PM    Final      Medications:     Scheduled Medications:  Chlorhexidine Gluconate Cloth  6 each Topical Daily   cholecalciferol  3,000 Units Oral Daily   cinacalcet  60 mg Oral QHS   colchicine  0.6 mg Oral BID   famotidine  20 mg Oral Daily   rosuvastatin  20 mg Oral QPM   senna-docusate  1 tablet Oral Daily    Infusions:  amiodarone 30 mg/hr (06/06/24 0700)    PRN Medications: acetaminophen  **OR** acetaminophen , acetaminophen , fluticasone , guaiFENesin-dextromethorphan, hydrALAZINE, ipratropium-albuterol, ondansetron  **OR** ondansetron  (ZOFRAN ) IV, mouth rinse     Assessment/Plan   1. Pericardial tamponade: Patient has had URI symptoms and had a non-COVID-19 coronavirus on respiratory viral cultures.  Suspect acute viral pericarditis as trigger for tamponade.  S/p pericardiocentesis with pericardial drain in place.  135 cc out  over last day.  Echo today with small residual effusion, no tamponade.  - Pending cytology.  - Will leave drain in today, pull when < 50 cc drainage/24 hrs.  - Continue colchicine 0.6 bid.   2. RV dysfunction: Echo was reviewed and showed mild-moderate RV systolic dysfunction with akinesis of the mid RV free wall, mild RV dilation.  CTA chest at admission was negative for PE.  ?Focal myocarditis but troponin negative on 10/17. Repeat Trop 10/20 low level at 42   - When drain is out, will arrange for cardiac MRI.  3. Atrial fibrillation: With RVR, in setting of acute pericarditis with pericardial effusion. Now back in NSR on amiodarone gtt.   - Continue amiodarone for now.  - Would hold off on anticoagulation for the time being, will need eventually.  4. Hypokalemia: 3.4 - give K supp   Keep in CCU until drain output < 50 cc/ 24 hrs   CRITICAL CARE Performed by: Caffie Shed   Total critical care time: 10 minutes  Critical care time was exclusive of separately billable procedures and treating other patients.  Critical care was necessary to treat or prevent imminent or life-threatening deterioration.  Critical care was time spent personally by me on the following activities: development of treatment plan with patient and/or surrogate as well as nursing, discussions with consultants, evaluation of patient's response to treatment, examination of patient, obtaining history from patient or surrogate, ordering and performing treatments and interventions, ordering and review of laboratory studies, ordering and review of radiographic studies, pulse oximetry and re-evaluation of patient's condition.  Caffie Shed, PA-C   Patient seen with PA, I formulated the plan and agree with the above note.   She had 135 cc out of pericardial drain yesterday, drainage  picked up after I flushed it. No further AF.  She was short of breath walking to the bed from the chair.   General: NAD Neck: JVP  8-9 cm, no thyromegaly or thyroid  nodule.  Lungs: Clear to auscultation bilaterally with normal respiratory effort. CV: Nondisplaced PMI.  Heart regular S1/S2, no S3/S4, no murmur.  Trace ankle edema.  Abdomen: Soft, nontender, no hepatosplenomegaly, no distention.  Skin: Intact without lesions or rashes.  Neurologic: Alert and oriented x 3.  Psych: Normal affect. Extremities: No clubbing or cyanosis.  HEENT: Normal.   Still with significant pericardial drainage.  Will keep drain in place today, hopefully out tomorrow. Suspect viral pericarditis.  With RV wall motion abnormality, ?myopericarditis though TnT only slightly elevated at 42.  Will arrange for cardiac MRI when drain is out.  Also pending cytology.    Possible mild volume overload, will give Lasix  40 mg IV x 1 again today.   CRITICAL CARE Performed by: Ezra Shuck  Total critical care time: 35 minutes  Critical care time was exclusive of separately billable procedures and treating other patients.  Critical care was necessary to treat or prevent imminent or life-threatening deterioration.  Critical care was time spent personally by me on the following activities: development of treatment plan with patient and/or surrogate as well as nursing, discussions with consultants, evaluation of patient's response to treatment, examination of patient, obtaining history from patient or surrogate, ordering and performing treatments and interventions, ordering and review of laboratory studies, ordering and review of radiographic studies, pulse oximetry and re-evaluation of patient's condition.  Ezra Shuck 06/06/2024 10:04 AM

## 2024-06-07 ENCOUNTER — Other Ambulatory Visit (HOSPITAL_COMMUNITY): Payer: Self-pay

## 2024-06-07 ENCOUNTER — Telehealth (HOSPITAL_COMMUNITY): Payer: Self-pay

## 2024-06-07 ENCOUNTER — Inpatient Hospital Stay (HOSPITAL_COMMUNITY)

## 2024-06-07 DIAGNOSIS — I301 Infective pericarditis: Secondary | ICD-10-CM

## 2024-06-07 DIAGNOSIS — I3139 Other pericardial effusion (noninflammatory): Secondary | ICD-10-CM | POA: Diagnosis not present

## 2024-06-07 LAB — CBC
HCT: 41.6 % (ref 36.0–46.0)
Hemoglobin: 12.9 g/dL (ref 12.0–15.0)
MCH: 26.2 pg (ref 26.0–34.0)
MCHC: 31 g/dL (ref 30.0–36.0)
MCV: 84.4 fL (ref 80.0–100.0)
Platelets: 257 K/uL (ref 150–400)
RBC: 4.93 MIL/uL (ref 3.87–5.11)
RDW: 13.5 % (ref 11.5–15.5)
WBC: 5.9 K/uL (ref 4.0–10.5)
nRBC: 0 % (ref 0.0–0.2)

## 2024-06-07 LAB — BASIC METABOLIC PANEL WITH GFR
Anion gap: 9 (ref 5–15)
BUN: 13 mg/dL (ref 8–23)
CO2: 27 mmol/L (ref 22–32)
Calcium: 8.7 mg/dL — ABNORMAL LOW (ref 8.9–10.3)
Chloride: 103 mmol/L (ref 98–111)
Creatinine, Ser: 0.78 mg/dL (ref 0.44–1.00)
GFR, Estimated: >60 mL/min (ref >60)
Glucose, Bld: 103 mg/dL — ABNORMAL HIGH (ref 70–99)
Potassium: 4 mmol/L (ref 3.5–5.1)
Sodium: 139 mmol/L (ref 135–145)

## 2024-06-07 LAB — CULTURE, BLOOD (ROUTINE X 2)
Culture: NO GROWTH
Special Requests: ADEQUATE

## 2024-06-07 MED ORDER — DIAZEPAM 2 MG PO TABS
2.0000 mg | ORAL_TABLET | Freq: Once | ORAL | Status: AC | PRN
  Administered 2024-06-07: 2 mg via ORAL
  Filled 2024-06-07: qty 1

## 2024-06-07 MED ORDER — AMIODARONE HCL 200 MG PO TABS
200.0000 mg | ORAL_TABLET | Freq: Two times a day (BID) | ORAL | Status: DC
Start: 2024-06-07 — End: 2024-06-08
  Administered 2024-06-07 – 2024-06-08 (×3): 200 mg via ORAL
  Filled 2024-06-07 (×3): qty 1

## 2024-06-07 MED ORDER — GADOBUTROL 1 MMOL/ML IV SOLN
10.0000 mL | Freq: Once | INTRAVENOUS | Status: AC | PRN
  Administered 2024-06-07: 10 mL via INTRAVENOUS

## 2024-06-07 NOTE — Progress Notes (Signed)
 NAME:  Katherine Bennett, MRN:  [id number], DOB:  12-20-1940, LOS: 5 ADMISSION DATE:  06/02/2024, CONSULTATION DATE:  06/04/2024 REFERRING MD:  Lue Fallow , CHIEF COMPLAINT: Shortness of breath  History of Present Illness:  83 year old female with hypertension, hyperlipidemia and irritable bowel syndrome who initially presented on 10/17 with increasing shortness of breath and cough for week at rest along hospital, on workup she was noted to have coronavirus NL 63 positive upon admission, she was admitted under hospitalist care with acute hypoxic respiratory failure in the setting of bilateral multifocal pneumonia with pleural effusion.  CT angiogram of chest was done which showed bilateral pleural effusion right more than left, also consistent with large pericardial effusion.  Echocardiogram was done today showing large pericardial effusion with cardiac tamponade physiology even though patient's blood pressure is stable.  Patient was transferred to [facility], underwent pericardiocentesis and pericardial drain in place, 770 cc of hemorrhagic fluid was drained.  Patient was admitted to ICU for close monitoring, PCCM was consulted for help evaluation medical management. After procedure patient stated breathing is much better, denies chest pain, nausea, vomiting or fever  Pertinent  Medical History   Past Medical History:  Diagnosis Date   Erb's palsy    History of squamous cell carcinoma    followed by dermatology   Hyperlipidemia    Hypertension    IBS (irritable bowel syndrome)    Obesity    Osteoporosis 2012     Significant Hospital Events: Including procedures, antibiotic start and stop dates in addition to other pertinent events   10/19 transferred from [facility] after being diagnosed with large pericardial effusion, underwent pericardiocentesis and drain placement 10/20 stated feeling better, oxygen requirement is coming down, currently on 2 L.  Denies chest  pain.  Pericardial drain output 2.3 cc in last 24 hours  Interim History / Subjective:  No acute events overnight, sats well on nasal cannula, afebrile, comfortable on a recliner Pericardial drain<20 cc  Pericardial drain DC'd today 10/22, pending cardiac MRI Will monitor and if stable can transfer out in the evening Therapeutic anticoagulation to be considered on 10/23    Objective    Blood pressure 123/62, pulse 83, temperature 98 F (36.7 C), temperature source Oral, resp. rate 17, height 5' 3 (1.6 m), weight 81.3 kg, SpO2 90%.        Intake/Output Summary (Last 24 hours) at 06/07/2024 0755 Last data filed at 06/07/2024 0744 Gross per 24 hour  Intake 1070.53 ml  Output 2918 ml  Net -1847.47 ml   Filed Weights   06/05/24 0600 06/06/24 0500 06/07/24 0200  Weight: 86.2 kg 86.2 kg 81.3 kg   Physical exam: General: Obese female, sitting on recliner HEENT: [place]/AT, eyes anicteric.  moist mucus membranes Neuro: Alert, awake following commands Chest: Air entry at the bases right more than left, otherwise clear, no wheezes or rhonchi Heart: Regular rate and rhythm, no murmurs or gallops Abdomen: Soft, nontender, nondistended, bowel sounds present  Labs reviewed  Patient Lines/Drains/Airways Status     Active Line/Drains/Airways     Name Placement date Placement time Site Days   Peripheral IV 06/02/24 22 G 1 Left;Anterior Forearm 06/02/24  1942  Forearm  3   Closed System Drain Inferior;Medial Pericardial Accordion (Hemovac) 06/04/24  1400  Pericardial  1          Resolved problem list   Assessment and Plan  Acute respiratory failure with hypoxia in the setting of coronavirus infection, POA  Bilateral pleural effusion Large pericardial effusion with cardiac tamponade physiology status post pericardiocentesis and pericardial drain in place Acute cor pulmonale Paroxysmal A-fib in the setting of large pericardial effusion, converted to sinus  rhythm Hypertension Hyperlipidemia Acute kidney injury due to cardiorenal syndrome, resolved Obesity  Patient stated breathing is better, currently on 2 L nasal cannula oxygen, encourage incentive spirometry and ambulation, titrate oxygen with O2 sat goal 90% Continue droplet precautions Repeat echocardiogram showed moderate RV dysfunction and dilatation, PE was ruled out Patient will need cardiac MRI once clinically better Remain in sinus rhythm, on amiodarone Continue aspirin  and Crestor Continue to hold antihypertensive Serum creatinine is back to baseline Diet and exercise counseling provided Pericardial drain<20 cc Pericardial drain DC'd today 10/22, pending cardiac MRI Will monitor and if stable can transfer out in the evening Therapeutic anticoagulation to be considered on 10/23   Labs   CBC: Recent Labs  Lab 06/02/24 1305 06/02/24 1338 06/03/24 0438 06/04/24 0439 06/05/24 0912 06/06/24 0354 06/07/24 0310  WBC 10.8*  --  8.5 7.6 6.0 5.5 5.9  NEUTROABS 8.3*  --   --   --   --   --   --   HGB 12.4   < > 11.9* 11.5* 12.1 11.8* 12.9  HCT 42.0   < > 39.7 38.7 38.8 38.0 41.6  MCV 86.4  --  88.8 87.2 84.5 85.0 84.4  PLT 303  --  256 272 230 219 257   < > = values in this interval not displayed.    Basic Metabolic Panel: Recent Labs  Lab 06/03/24 0438 06/04/24 0439 06/05/24 0238 06/05/24 2022 06/06/24 0354 06/07/24 0310  NA 139 137 138 139 139 139  K 4.5 4.4 3.6 4.1 3.4* 4.0  CL 103 101 104 103 105 103  CO2 22 22 23 26 26 27   GLUCOSE 142* 143* 114* 146* 117* 103*  BUN 17 23 18 15 14 13   CREATININE 0.93 1.24* 0.97 1.00 0.82 0.78  CALCIUM  9.3 9.5 8.5* 8.3* 8.0* 8.7*  MG 1.8  --  1.6* 2.0  --   --    GFR: Estimated Creatinine Clearance: 51.9 mL/min (by C-G formula based on SCr of 0.78 mg/dL). Recent Labs  Lab 06/02/24 1337 06/02/24 1639 06/03/24 0438 06/04/24 0439 06/05/24 0912 06/06/24 0354 06/07/24 0310  WBC  --   --    < > 7.6 6.0 5.5 5.9   LATICACIDVEN 1.2 1.7  --   --   --   --   --    < > = values in this interval not displayed.    Liver Function Tests: Recent Labs  Lab 06/02/24 1305 06/04/24 1829  AST 18  --   ALT 18  --   ALKPHOS 118  --   BILITOT 1.6*  --   PROT 6.7  --   ALBUMIN 4.1 3.0*   Recent Labs  Lab 06/02/24 1305  LIPASE 12   No results for input(s): AMMONIA in the last 168 hours.  ABG    Component Value Date/Time   HCO3 27.2 06/02/2024 1305   TCO2 22 06/02/2024 1338   O2SAT 72.3 06/02/2024 1305     Coagulation Profile: No results for input(s): INR, PROTIME in the last 168 hours.  Cardiac Enzymes: Recent Labs  Lab 06/02/24 2051  CKTOTAL 63    HbA1C: No results found for: HGBA1C  CBG: No results for input(s): GLUCAP in the last 168 hours.      Lenny Drought, MD  [facility]  Pulmonary Critical Care Prefer epic messenger for cross cover needs   My critical care time: 40 minutes  Critical care time was exclusive of separately billable procedures and treating other patients.  Critical care was necessary to treat or prevent imminent or life-threatening deterioration.  Critical care was time spent personally by me on the following activities: development of treatment plan with patient and/or surrogate as well as nursing, discussions with consultants, evaluation of patient's response to treatment, examination of patient, obtaining history from patient or surrogate, ordering and performing treatments and interventions, ordering and review of laboratory studies, ordering and review of radiographic studies, pulse oximetry, re-evaluation of patient's condition and participation in multidisciplinary rounds.  06/07/2024, 10:08 AM

## 2024-06-07 NOTE — Plan of Care (Signed)

## 2024-06-07 NOTE — Progress Notes (Addendum)
 Advanced Heart Failure Rounding Note  Cardiologist: Dr. Delford (New)  Chief Complaint: Large Pericardial Effusion w/ Tamponade   Patient Profile   83 y/o female w/ no prior cardiac history admitted to CCU for large pericardial effusion w/ tamponade, in setting of acute respiratory viral infection. S/p pericardicentesis 10/19 w/ removal of 770 cc of hemorrhagic fluid. Course c/b Afib w/ RVR.    Subjective:    Limited Echo 10/20: small pericardial effusion surrounding the apex, localized near the right ventricle and posterior to the left ventricle. No tamponade. LVEF 55-60%, RV mod reduced w/ AK of right ventricular free wall  Hemodynamics stable.   Pericardial drain output past 24 hr = 18 cc   Cytology: No malignant cells   Remains on colchicine.     Maintaining NSR. On amio gtt at 30/hr   Good UOP w/ IV Lasix  yesterday, 2.9L. Wt down. SCr stable 0.78, K 4.0  OOB sitting up in chair. Breathing improved. No longer w/ supp O2 requirements. No chest pain.    Objective:   Weight Range: 81.3 kg Body mass index is 31.75 kg/m.   Vital Signs:   Temp:  [97.6 F (36.4 C)-98.4 F (36.9 C)] 98 F (36.7 C) (10/22 0744) Pulse Rate:  [70-111] 83 (10/22 0700) Resp:  [14-32] 17 (10/22 0700) BP: (105-132)/(52-71) 123/62 (10/22 0700) SpO2:  [75 %-98 %] 90 % (10/22 0700) Weight:  [81.3 kg] 81.3 kg (10/22 0200) Last BM Date : 06/06/24  Weight change: Filed Weights   06/05/24 0600 06/06/24 0500 06/07/24 0200  Weight: 86.2 kg 86.2 kg 81.3 kg    Intake/Output:   Intake/Output Summary (Last 24 hours) at 06/07/2024 0756 Last data filed at 06/07/2024 0744 Gross per 24 hour  Intake 1070.53 ml  Output 2918 ml  Net -1847.47 ml      Physical Exam   GENERAL: NAD Lungs- clear  CARDIAC:  JVP not elevated         Normal rate with regular rhythm. No MRG. No LEE  + subxiphoid pericardial drain  ABDOMEN: Soft, non-tender, non-distended.  EXTREMITIES: Warm and well perfused.   NEUROLOGIC: No obvious FND  Telemetry   NSR 80s, personally reviewed   EKG    N/A   Labs    CBC Recent Labs    06/06/24 0354 06/07/24 0310  WBC 5.5 5.9  HGB 11.8* 12.9  HCT 38.0 41.6  MCV 85.0 84.4  PLT 219 257   Basic Metabolic Panel Recent Labs    89/79/74 0238 06/05/24 2022 06/06/24 0354 06/07/24 0310  NA 138 139 139 139  K 3.6 4.1 3.4* 4.0  CL 104 103 105 103  CO2 23 26 26 27   GLUCOSE 114* 146* 117* 103*  BUN 18 15 14 13   CREATININE 0.97 1.00 0.82 0.78  CALCIUM  8.5* 8.3* 8.0* 8.7*  MG 1.6* 2.0  --   --    Liver Function Tests Recent Labs    06/04/24 1829  ALBUMIN 3.0*   No results for input(s): LIPASE, AMYLASE in the last 72 hours.  Cardiac Enzymes No results for input(s): CKTOTAL, CKMB, CKMBINDEX, TROPONINI in the last 72 hours.   BNP: BNP (last 3 results) No results for input(s): BNP in the last 8760 hours.  ProBNP (last 3 results) Recent Labs    06/02/24 1305  PROBNP 268.0     D-Dimer No results for input(s): DDIMER in the last 72 hours. Hemoglobin A1C No results for input(s): HGBA1C in the last 72 hours. Fasting Lipid  Panel No results for input(s): CHOL, HDL, LDLCALC, TRIG, CHOLHDL, LDLDIRECT in the last 72 hours. Thyroid  Function Tests No results for input(s): TSH, T4TOTAL, T3FREE, THYROIDAB in the last 72 hours.  Invalid input(s): FREET3  Other results:   Imaging    No results found.    Medications:     Scheduled Medications:  Chlorhexidine Gluconate Cloth  6 each Topical Daily   cholecalciferol  3,000 Units Oral Daily   cinacalcet  60 mg Oral QHS   colchicine  0.6 mg Oral BID   famotidine  20 mg Oral Daily   polyethylene glycol  17 g Oral Daily   rosuvastatin  20 mg Oral QPM   senna-docusate  1 tablet Oral Daily    Infusions:  amiodarone 30 mg/hr (06/07/24 0700)    PRN Medications: acetaminophen  **OR** acetaminophen , acetaminophen , fluticasone ,  guaiFENesin-dextromethorphan, hydrALAZINE, ipratropium-albuterol, ondansetron  **OR** ondansetron  (ZOFRAN ) IV, mouth rinse     Assessment/Plan   1. Pericardial tamponade: Patient has had URI symptoms and had a non-COVID-19 coronavirus on respiratory viral cultures.  Suspect acute viral pericarditis as trigger for tamponade.  S/p pericardiocentesis with pericardial drain in place. Repeat Limited echo 10/19 showed small residual effusion, no tamponade. C/w pericardial drain, 18cc out over last day.  Cytology showed no malignant cells.  - pull pericardial drain today  - Continue colchicine 0.6 bid. Continue x 3 months    2. RV dysfunction: Echo was reviewed and showed mild-moderate RV systolic dysfunction with akinesis of the mid RV free wall, mild RV dilation.  CTA chest at admission was negative for PE.  ?Focal myocarditis but troponin negative on 10/17. Repeat Trop 10/20 low level at 42   - Plan cMRI later today after pericardial drain pulled   3. Atrial fibrillation: With RVR, in setting of acute pericarditis with pericardial effusion. Now back in NSR on amiodarone gtt.   - Will transition to PO amio today, 200 mg bid.  - Would hold off on anticoagulation for the time being, will need eventually.  4. Hypokalemia: resolved w/ K supp  - K 4.0   From cardiac standpoint, can transfer out of CCU after drain pulled   Caffie Shed, PA-C 06/07/2024  Patient seen with PA, I formulated the plan and agree with the above note.   Pericardial drain with minimal output over the last day. Feels good with no chest pain.  No further atrial fibrillation.   General: NAD Neck: No JVD, no thyromegaly or thyroid  nodule.  Lungs: Clear to auscultation bilaterally with normal respiratory effort. CV: Nondisplaced PMI.  Heart regular S1/S2, no S3/S4, no murmur, no friction rub.  No peripheral edema.    Abdomen: Soft, nontender, no hepatosplenomegaly, no distention.  Skin: Intact without lesions or rashes.   Neurologic: Alert and oriented x 3.  Psych: Normal affect. Extremities: No clubbing or cyanosis.  HEENT: Normal.   Volume status looks ok, does not need Lasix  today.   Pericardial drain removed.  Patient has acute pericarditis, suspect viral.  Now pain-free.  Cytology negative from pericardial fluid.  With RV wall motion abnormality on echo, ?RV involvement with myopericarditis.  Will order MRI today now that drain is out, will need Valium prior to study due to claustrophobia.  No further AF.  Transition amiodarone to po, would continue amiodarone x 1 month then stop if no further AF.  I think she can probably start anticoagulation tomorrow if she remains stable with drain out.   OK for telemetry.   Ezra Shuck 06/07/2024 9:47  AM

## 2024-06-07 NOTE — Progress Notes (Signed)
 Physical Therapy Treatment Patient Details Name: Katherine Bennett MRN: [id number] DOB: Jun 11, 1941 Today's Date: 06/07/2024   History of Present Illness 83 y/o F presenting to [facility] ED on 10/19 with worsening SOB and cough x1 week, found to have PNA, positive for coronavirus subtype NL63. Found to have newly diagnosed pericardial effusion R>L, with tamponade in the setting of URI.  Transferred to [facility] for further treatment. S/p pericardiocentesis (drained 770cc of fluid) and drain in place.    PMH includes HLD, HTN, IBS, obesity, osteoporosis, Erb's palsy.    PT Comments  Pt is progressing towards goals. Currently Mod I for bed mobility and sit to stand without an AD. Pt was CGA for safety with 400 ft of gait without an AD on 1 L O2 via [place]. Daughter present during session. Due to pt current functional status, home set up and available assistance at home recommending skilled physical therapy services 3x/week in order to address strength, balance and functional mobility to decrease risk for falls, injury and re-hospitalization.       If plan is discharge home, recommend the following: A little help with walking and/or transfers;A little help with bathing/dressing/bathroom;Assistance with cooking/housework;Help with stairs or ramp for entrance     Equipment Recommendations  None recommended by PT       Precautions / Restrictions Precautions Precautions: Fall;Other (comment) Recall of Precautions/Restrictions: Intact Precaution/Restrictions Comments: monitor O2 Restrictions Weight Bearing Restrictions Per Provider Order: No     Mobility  Bed Mobility Overal bed mobility: Modified Independent     General bed mobility comments: supine to sitting    Transfers Overall transfer level: Modified independent Equipment used: None Transfers: Sit to/from Stand Sit to Stand: Modified independent (Device/Increase time)      Ambulation/Gait Ambulation/Gait assistance: Supervision, Contact guard  assist Gait Distance (Feet): 400 Feet Assistive device: None Gait Pattern/deviations: Step-through pattern, Decreased stride length Gait velocity: decreased Gait velocity interpretation: 1.31 - 2.62 ft/sec, indicative of limited community ambulator   General Gait Details: Pt on 1L O2 O2 sats remained in the 90's during gait. CGA for safety without an AD. HR stable     Balance Overall balance assessment: Needs assistance Sitting-balance support: No upper extremity supported, Feet supported Sitting balance-Leahy Scale: Good     Standing balance support: No upper extremity supported Standing balance-Leahy Scale: Fair Standing balance comment: no significant deviations; CGA for safety      Communication Communication Communication: Impaired Factors Affecting Communication: Hearing impaired  Cognition Arousal: Alert Behavior During Therapy: WFL for tasks assessed/performed   PT - Cognitive impairments: No apparent impairments     Following commands: Intact      Cueing Cueing Techniques: Verbal cues     General Comments General comments (skin integrity, edema, etc.): vital signs stable on 1 L O2 via [place]      Pertinent Vitals/Pain Pain Assessment Pain Assessment: No/denies pain     PT Goals (current goals can now be found in the care plan section) Acute Rehab PT Goals Patient Stated Goal: Regain IND and get off the oxygen PT Goal Formulation: With patient Time For Goal Achievement: 06/16/24 Potential to Achieve Goals: Good Progress towards PT goals: Progressing toward goals    Frequency    Min 2X/week      PT Plan  Continue with current POC        AM-PAC PT 6 Clicks Mobility   Outcome Measure  Help needed turning from your back to your side while in a flat bed without  using bedrails?: None Help needed moving from lying on your back to sitting on the side of a flat bed without using bedrails?: None Help needed moving to and from a bed to a chair  (including a wheelchair)?: A Little Help needed standing up from a chair using your arms (e.g., wheelchair or bedside chair)?: None Help needed to walk in hospital room?: A Little Help needed climbing 3-5 steps with a railing? : A Little 6 Click Score: 21    End of Session Equipment Utilized During Treatment: Gait belt;Oxygen Activity Tolerance: Patient tolerated treatment well Patient left: with call bell/phone within reach;with family/visitor present;in chair Nurse Communication: Mobility status PT Visit Diagnosis: Unsteadiness on feet (R26.81);Difficulty in walking, not elsewhere classified (R26.2);Other abnormalities of gait and mobility (R26.89)     Time: 8461-8398 PT Time Calculation (min) (ACUTE ONLY): 23 min  Charges:    $Therapeutic Activity: 23-37 mins PT General Charges $$ ACUTE PT VISIT: 1 Visit                     Dorothyann Maier, DPT, CLT  Acute Rehabilitation Services Office: [phone] (Secure chat preferred)    Dorothyann VEAR Maier 06/07/2024, 5:05 PM

## 2024-06-07 NOTE — Progress Notes (Signed)
 Occupational Therapy Treatment Patient Details Name: Katherine Bennett MRN: [id number] DOB: 1941-01-15 Today's Date: 06/07/2024   History of present illness 83 y/o F presenting to [facility] ED on 10/19 with worsening SOB and cough x1 week, found to have PNA, positive for coronavirus subtype NL63. Found to have newly diagnosed pericardial effusion R>L, with tamponade in the setting of URI.  Transferred to [facility] for further treatment. S/p pericardiocentesis (drained 770cc of fluid) and drain in place.    PMH includes HLD, HTN, IBS, obesity, osteoporosis, Erb's palsy.   OT comments  Pt motivated to ambulate. Lightly stabilized to walk in unit pushing IV pole. Completed toileting and 3 standing grooming activities with supervision. SpO2 down to 85% briefly with ambulation on RW, not a consistent pleth. Cued to stop and perform PLB. O2 at rest 93% on RA. Pt with excellent family support. Do not anticipate need for post acute OT upon discharge.       If plan is discharge home, recommend the following:  A little help with walking and/or transfers;A little help with bathing/dressing/bathroom;Assistance with cooking/housework;Direct supervision/assist for medications management;Direct supervision/assist for financial management;Assist for transportation;Help with stairs or ramp for entrance   Equipment Recommendations  Tub/shower seat    Recommendations for Other Services      Precautions / Restrictions Precautions Precautions: Fall;Other (comment) Recall of Precautions/Restrictions: Intact Precaution/Restrictions Comments: monitor O2 Restrictions Weight Bearing Restrictions Per Provider Order: No       Mobility Bed Mobility Overal bed mobility: Modified Independent             General bed mobility comments: to return to supine    Transfers Overall transfer level: Needs assistance Equipment used: None Transfers: Sit to/from Stand Sit to Stand: Supervision           General transfer  comment: choosing to push IV pole, hand held when assessing O2 sats     Balance Overall balance assessment: Needs assistance   Sitting balance-Leahy Scale: Good       Standing balance-Leahy Scale: Fair                             ADL either performed or assessed with clinical judgement   ADL Overall ADL's : Needs assistance/impaired     Grooming: Wash/dry hands;Wash/dry face;Oral care;Standing;Supervision/safety           Upper Body Dressing : Minimal assistance;Standing       Toilet Transfer: Supervision/safety;Regular Social worker and Hygiene: Supervision/safety;Sit to/from stand       Functional mobility during ADLs: Minimal assistance;Supervision/safety      Extremity/Trunk Assessment              Vision       Perception     Praxis     Communication Communication Communication: Impaired Factors Affecting Communication: Hearing impaired   Cognition Arousal: Alert Behavior During Therapy: WFL for tasks assessed/performed Cognition: Difficult to assess Difficult to assess due to: Hard of hearing/deaf                             Following commands: Intact        Cueing   Cueing Techniques: Verbal cues  Exercises      Shoulder Instructions       General Comments      Pertinent Vitals/ Pain       Pain Assessment Pain Assessment:  No/denies pain  Home Living                                          Prior Functioning/Environment              Frequency  Min 2X/week        Progress Toward Goals  OT Goals(current goals can now be found in the care plan section)  Progress towards OT goals: Progressing toward goals  Acute Rehab OT Goals OT Goal Formulation: With patient Time For Goal Achievement: 06/19/24 Potential to Achieve Goals: Good  Plan      Co-evaluation                 AM-PAC OT 6 Clicks Daily Activity     Outcome Measure    Help from another person eating meals?: None Help from another person taking care of personal grooming?: A Little Help from another person toileting, which includes using toliet, bedpan, or urinal?: A Little Help from another person bathing (including washing, rinsing, drying)?: A Little Help from another person to put on and taking off regular upper body clothing?: A Little Help from another person to put on and taking off regular lower body clothing?: A Little 6 Click Score: 19    End of Session Equipment Utilized During Treatment: Gait belt  OT Visit Diagnosis: Unsteadiness on feet (R26.81);Other abnormalities of gait and mobility (R26.89);Muscle weakness (generalized) (M62.81)   Activity Tolerance Patient tolerated treatment well   Patient Left with call bell/phone within reach;in chair;with family/visitor present   Nurse Communication          Time: 8849-8784 OT Time Calculation (min): 25 min  Charges: OT General Charges $OT Visit: 1 Visit OT Treatments $Self Care/Home Management : 8-22 mins $Therapeutic Activity: 8-22 mins Mliss HERO, OTR/L Acute Rehabilitation Services Office: [phone]   [name] 06/07/2024, 1:54 PM

## 2024-06-07 NOTE — Telephone Encounter (Signed)
 Pharmacy Patient Advocate Encounter  Insurance verification completed.    The patient is insured through [facility]. Patient has Medicare and is not eligible for a copay card, but may be able to apply for patient assistance or Medicare RX Payment Plan (Patient Must reach out to their plan, if eligible for payment plan), if available.    Ran test claim for Farxiga 10mg  and the current 30 day co-pay is $291.77   Ran test claim for Eliquis 5mg  and the current 30 day co-pay is $291.77   This test claim was processed through [employer]- copay amounts may vary at other pharmacies due to [employer], or as the patient moves through the different stages of their insurance plan.

## 2024-06-08 DIAGNOSIS — J189 Pneumonia, unspecified organism: Secondary | ICD-10-CM | POA: Diagnosis not present

## 2024-06-08 DIAGNOSIS — I3139 Other pericardial effusion (noninflammatory): Secondary | ICD-10-CM

## 2024-06-08 LAB — CBC
HCT: 39.9 % (ref 36.0–46.0)
Hemoglobin: 12.4 g/dL (ref 12.0–15.0)
MCH: 26.3 pg (ref 26.0–34.0)
MCHC: 31.1 g/dL (ref 30.0–36.0)
MCV: 84.7 fL (ref 80.0–100.0)
Platelets: 253 K/uL (ref 150–400)
RBC: 4.71 MIL/uL (ref 3.87–5.11)
RDW: 13.1 % (ref 11.5–15.5)
WBC: 5.6 K/uL (ref 4.0–10.5)
nRBC: 0 % (ref 0.0–0.2)

## 2024-06-08 LAB — BASIC METABOLIC PANEL WITH GFR
Anion gap: 9 (ref 5–15)
BUN: 18 mg/dL (ref 8–23)
CO2: 26 mmol/L (ref 22–32)
Calcium: 8.6 mg/dL — ABNORMAL LOW (ref 8.9–10.3)
Chloride: 104 mmol/L (ref 98–111)
Creatinine, Ser: 0.93 mg/dL (ref 0.44–1.00)
GFR, Estimated: >60 mL/min (ref >60)
Glucose, Bld: 118 mg/dL — ABNORMAL HIGH (ref 70–99)
Potassium: 4.1 mmol/L (ref 3.5–5.1)
Sodium: 139 mmol/L (ref 135–145)

## 2024-06-08 LAB — BODY FLUID CULTURE W GRAM STAIN: Culture: NO GROWTH

## 2024-06-08 MED ORDER — AMIODARONE HCL 200 MG PO TABS: 200.0000 mg | ORAL_TABLET | Freq: Two times a day (BID) | ORAL | 0 refills | Status: DC

## 2024-06-08 MED ORDER — COLCHICINE 0.6 MG PO TABS: 0.6000 mg | ORAL_TABLET | Freq: Two times a day (BID) | ORAL | 0 refills | Status: DC

## 2024-06-08 MED ORDER — FAMOTIDINE 20 MG PO TABS: 20.0000 mg | ORAL_TABLET | Freq: Every day | ORAL | 0 refills | Status: AC

## 2024-06-08 MED ORDER — GUAIFENESIN-DM 100-10 MG/5ML PO SYRP: 5.0000 mL | ORAL_SOLUTION | ORAL | 0 refills | Status: AC | PRN

## 2024-06-08 NOTE — Progress Notes (Addendum)
 Patient ambulated 190 feet by RN at 1044. Pt O2 saturation 84% while ambulating.2L [place] applied,  provider made aware. O2 increased to 96% on 2L [place] while resting

## 2024-06-08 NOTE — Discharge Summary (Addendum)
 Physician Discharge Summary   Patient: Katherine Bennett MRN: [id number] DOB: 12/15/1940  Admit date:     06/02/2024  Discharge date: 06/08/24  Discharge Physician: Deliliah Room   PCP: Joneen Nest, MD   Recommendations at discharge:    F/u with your PCP in one week F/u with cardiology on the scheduled appointment on 10/29.  Discharge Diagnoses: Principal Problem:   Pneumonia Active Problems:   Acute respiratory failure with hypoxia (HCC)   Pericardial tamponade   Hospital Course:  83 years old female with past medical history of hyperlipidemia hypertension irritable bowel syndrome obesity and osteoporosis presented to hospital with complaints of nausea vomiting and diarrhea for the last 4 days with shortness of breath nonproductive cough for almost a week or so.  She also had generalized aches, chills and subjective fever at home.  For the last 1 day, she had increasing shortness of breath especially on ambulation and EMS was called and who noted hypoxia.  Patient was put on supplemental oxygen, given Zofran  IV fluid bolus and was brought into the hospital for further evaluation and treatment.   In the ED patient was tachycardic, was tachypneic and tachycardic required 8 L of high flow nasal cannula oxygen to keep saturation in the 90s patient was afebrile in the ED but had taken Tylenol  prior to coming to the hospital.  Labs were notable for WBC slightly elevated at 10.8 with hemoglobin at 12.4.  Lactic acid was 1.2 BMP was notable for glucose elevation at 160 proBNP 268 with a lipase of 12 and COVID influenza and RSV was negative.  VBG was within normal range.  Chest x-ray done in the ED showed bilateral perihilar interstitial opacities which represent possible atypical viral infection.  Hazy airspace opacities bilateral lungs as well.  In the ED patient was given azithromycin, Rocephin  blood cultures were drawn and patient was then considered for admission to hospital for  further evaluation and treatment.  CT angiogram of chest was done which showed bilateral pleural effusion right more than left, also consistent with large pericardial effusion. Echocardiogram was done showing large pericardial effusion with cardiac tamponade physiology even though patient's blood pressure is stable.  Patient was transferred to [facility], underwent pericardiocentesis and pericardial drain in place, 770 cc of hemorrhagic fluid was drained. Patient was admitted to ICU for close monitoring  Pericardial drain discontinued on 10/22. TR[facility] assumed care on 10/23. cMRI EF 63% Mild RV dysfunction EF 45% with midwall AK Pericardium normal with small effusion. Will need outpatient ECHO in 3 months. Discharged on oral amiodarone 200 mg PO BID and colchicine 0.6 mg PO BID x 3 months. Outpatient f/u with cardiology on 10/29  done on the day of the discharge and home oxygen arranged (2 L on exertion). HHRN and PT arranged as well.       Consultants: Cardiology Procedures performed: Pericardiocentesis with pericardial drain placement with subsequent removal  Disposition: Home Diet recommendation:  Cardiac diet DISCHARGE MEDICATION: Allergies as of 06/08/2024       Reactions   Losartan  Other (See Comments)   Persistent hyperkalemia   Morphine Other (See Comments)   Delusions/Psychosis/Stated it made her feel crazy   Morphine And Codeine Other (See Comments)   Hallucinations   Spironolactone Other (See Comments)   Hyperkalemia        Medication List     STOP taking these medications    amLODipine 5 MG tablet Commonly known as: NORVASC   aspirin  EC 81 MG  tablet   furosemide  20 MG tablet Commonly known as: LASIX    losartan  100 MG tablet Commonly known as: COZAAR    naproxen  250 MG tablet Commonly known as: NAPROSYN        TAKE these medications    acetaminophen  500 MG tablet Commonly known as: TYLENOL  Take 1,000 mg by mouth every 6 (six) hours  as needed (for pain for headaches).   amiodarone 200 MG tablet Commonly known as: PACERONE Take 1 tablet (200 mg total) by mouth 2 (two) times daily.   cetirizine 10 MG tablet Commonly known as: ZYRTEC Take 10 mg by mouth daily as needed for allergies or rhinitis.   cinacalcet 60 MG tablet Commonly known as: SENSIPAR Take 60 mg by mouth at bedtime.   colchicine 0.6 MG tablet Take 1 tablet (0.6 mg total) by mouth 2 (two) times daily.   famotidine 20 MG tablet Commonly known as: PEPCID Take 1 tablet (20 mg total) by mouth daily. Start taking on: June 09, 2024   Flonase  50 MCG/ACT nasal spray Generic drug: fluticasone  Place 1 spray into both nostrils daily as needed (seasonal allergies).   guaiFENesin-dextromethorphan 100-10 MG/5ML syrup Commonly known as: ROBITUSSIN DM Take 5 mLs by mouth every 4 (four) hours as needed for cough.   Liquid I.V. Pack Take 1 packet by mouth daily.   MAGNESIUM CITRATE PO Take 1 tablet by mouth daily.   metFORMIN 500 MG 24 hr tablet Commonly known as: GLUCOPHAGE-XR Take 500 mg by mouth daily with breakfast.   NON FORMULARY Take 0.5 Pieces by mouth See admin instructions. THC-based gummie - Chew 0.5 gummie by mouth at bedtime to aid sleep   rosuvastatin 20 MG tablet Commonly known as: CRESTOR Take 20 mg by mouth at bedtime.   Turmeric Curcumin Caps Take 1 capsule by mouth daily.   Vitamin D3 75 MCG (3000 UT) Tabs Take 3,000 Units by mouth daily.               Durable Medical Equipment  (From admission, onward)           Start     Ordered   06/08/24 1335  For home use only DME oxygen  Once       Question Answer Comment  Length of Need 6 Months   Mode or (Route) Nasal cannula   Liters per Minute 2   Frequency Continuous (stationary and portable oxygen unit needed)   Oxygen conserving device Yes   Oxygen delivery system: Gas   Oxygen delivery system: Concentrator   Oxygen delivery system: Portable concentrator  (POC)      06/08/24 1342            Follow-up Information     [facility] Follow up.   Specialty: Cardiology Why: Hospital follow up for your heart   06/14/24 at 9:45 AM at [facility] (Entrance C/ [facility], Valet parking availiable) Solicitor information: [address] [place]  [address] [phone]        Joneen Nest, MD. Schedule an appointment as soon as possible for a visit in 1 week(s).   Specialty: Family Medicine Contact information: [address] [place] [address] [phone]                Discharge Exam: Fredricka Weights   06/07/24 0200 06/08/24 0500 06/08/24 1324  Weight: 81.3 kg 79.9 kg 79.9 kg   Constitutional: NAD, calm, comfortable Eyes: PERRL, lids and conjunctivae  normal ENMT: Mucous membranes are moist. Posterior pharynx clear of any exudate or lesions.Normal dentition.  Neck: normal, supple, no masses, no thyromegaly Respiratory: clear to auscultation bilaterally, no wheezing, no crackles. Normal respiratory effort. No accessory muscle use.  Cardiovascular: Regular rate and rhythm, no murmurs / rubs / gallops. No extremity edema. 2+ pedal pulses. No carotid bruits.  Abdomen: no tenderness, no masses palpated. No hepatosplenomegaly. Bowel sounds positive.  Musculoskeletal: no clubbing / cyanosis. No joint deformity upper and lower extremities. Good ROM, no contractures. Normal muscle tone.  Skin: no rashes, lesions, ulcers. No induration Neurologic: CN 2-12 grossly intact. Sensation intact, DTR normal. Strength 5/5 x all 4 extremities.  Psychiatric: Normal judgment and insight. Alert and oriented x 3. Normal mood.    Condition at discharge: good  The results of significant diagnostics from this hospitalization (including imaging, microbiology, ancillary and laboratory) are listed below for reference.   Imaging Studies: MR CARDIAC  MORPHOLOGY W WO CONTRAST Result Date: 06/07/2024 CLINICAL DATA:  Clinical question of right ventricular dysfunction Study assumes BSA of 1.90 ms. EXAM: CARDIAC MRI TECHNIQUE: The patient was scanned on a 1.5 Tesla GE magnet. A dedicated cardiac coil was used. Functional imaging was done using Fiesta sequences. 2,3, and 4 chamber views were done to assess for RWMA's. Modified Simpson's rule using was used to calculate an ejection fraction on a dedicated work [employer], [employer]. The patient received 10 cc of Gadavist. After 10 minutes inversion recovery sequences were used to assess for infiltration and scar tissue. Flow quantification was performed 2 times during this examination with flow quantification performed at the levels of the ascending aorta above the valve, pulmonary artery above the valve. CONTRAST:  10 cc  of Gadavist FINDINGS: 1. Normal left ventricular size, with LVEDD 40 mm, and LVEDVi 59 mL/m2. Normal left ventricular thickness. Normal left ventricular systolic function (LVEF =63%). There are no regional wall motion abnormalities. Left ventricular parametric mapping notable for elevated T2 (apical septal 68 ms). ECV elevated in basal inferolateral (46%). There is late gadolinium enhancement in the left ventricular myocardium: Basal inferolateral mid myocardial stripe. Cannot exclude abnormal basal septal resting perfusion. 2. Normal right ventricular size with RVEDVI 69 mL/m2, though mild dilation in RVIDD 41 mm. Normal right ventricular thickness. Mild decrease in right ventricular systolic function (RVEF =45%). Mid free wall akinesis. 3.  Normal left and right atrial size. 4. Normal size of the aortic root, ascending aorta and pulmonary artery. 5. Valve assessment: Aortic Valve: Thickened trileaflet aortic valve. Mean gradient 1 mm Hg. Qualitatively, there is no significant regurgitation. Regurgitant fraction 1%. Pulmonic Valve: Qualitatively, there is no significant regurgitation.  Regurgitant fraction < 1%. Tricuspid Valve: Tri-leaflet valve. Qualitatively, there is no significant regurgitation. Mitral Valve:Qualitatively, there is no significant regurgitation. 6.  Normal pericardium.  Small circumferential pericardial effusion. 7. Grossly, bilateral pleural effusions. Recommended dedicated study if concerned for non-cardiac pathology. IMPRESSION: 1. Normal left ventricular systolic function (LVEF =63%). 2. Mild decrease in right ventricular systolic function (RVEF =45%). Mid free wall akinesis with normal index RV volume. This and LV LGE signal are two minor imaging criteria for AVC syndromes (2024 guidelines update). Alternatively, parametric mapping and LGE signal could be seen in myocarditis, this would be less consistent with the isolated RV findings and the nominal increase in troponin testing. Clinical correlation advised. Stanly Leavens MD Electronically Signed   By: Stanly Leavens M.D.   On: 06/07/2024 23:06   MR CARDIAC VELOCITY FLOW MAP Result Date:  06/07/2024 CLINICAL DATA:  Clinical question of right ventricular dysfunction Study assumes BSA of 1.90 ms. EXAM: CARDIAC MRI TECHNIQUE: The patient was scanned on a 1.5 Tesla GE magnet. A dedicated cardiac coil was used. Functional imaging was done using Fiesta sequences. 2,3, and 4 chamber views were done to assess for RWMA's. Modified Simpson's rule using was used to calculate an ejection fraction on a dedicated work [employer] officer, trade [employer]. The patient received 10 cc of Gadavist. After 10 minutes inversion recovery sequences were used to assess for infiltration and scar tissue. Flow quantification was performed 2 times during this examination with flow quantification performed at the levels of the ascending aorta above the valve, pulmonary artery above the valve. CONTRAST:  10 cc  of Gadavist FINDINGS: 1. Normal left ventricular size, with LVEDD 40 mm, and LVEDVi 59 mL/m2. Normal left ventricular thickness.  Normal left ventricular systolic function (LVEF =63%). There are no regional wall motion abnormalities. Left ventricular parametric mapping notable for elevated T2 (apical septal 68 ms). ECV elevated in basal inferolateral (46%). There is late gadolinium enhancement in the left ventricular myocardium: Basal inferolateral mid myocardial stripe. Cannot exclude abnormal basal septal resting perfusion. 2. Normal right ventricular size with RVEDVI 69 mL/m2, though mild dilation in RVIDD 41 mm. Normal right ventricular thickness. Mild decrease in right ventricular systolic function (RVEF =45%). Mid free wall akinesis. 3.  Normal left and right atrial size. 4. Normal size of the aortic root, ascending aorta and pulmonary artery. 5. Valve assessment: Aortic Valve: Thickened trileaflet aortic valve. Mean gradient 1 mm Hg. Qualitatively, there is no significant regurgitation. Regurgitant fraction 1%. Pulmonic Valve: Qualitatively, there is no significant regurgitation. Regurgitant fraction < 1%. Tricuspid Valve: Tri-leaflet valve. Qualitatively, there is no significant regurgitation. Mitral Valve:Qualitatively, there is no significant regurgitation. 6.  Normal pericardium.  Small circumferential pericardial effusion. 7. Grossly, bilateral pleural effusions. Recommended dedicated study if concerned for non-cardiac pathology. IMPRESSION: 1. Normal left ventricular systolic function (LVEF =63%). 2. Mild decrease in right ventricular systolic function (RVEF =45%). Mid free wall akinesis with normal index RV volume. This and LV LGE signal are two minor imaging criteria for AVC syndromes (2024 guidelines update). Alternatively, parametric mapping and LGE signal could be seen in myocarditis, this would be less consistent with the isolated RV findings and the nominal increase in troponin testing. Clinical correlation advised. Stanly Leavens MD Electronically Signed   By: Stanly Leavens M.D.   On: 06/07/2024 23:06   MR  CARDIAC VELOCITY FLOW MAP Result Date: 06/07/2024 CLINICAL DATA:  Clinical question of right ventricular dysfunction Study assumes BSA of 1.90 ms. EXAM: CARDIAC MRI TECHNIQUE: The patient was scanned on a 1.5 Tesla GE magnet. A dedicated cardiac coil was used. Functional imaging was done using Fiesta sequences. 2,3, and 4 chamber views were done to assess for RWMA's. Modified Simpson's rule using was used to calculate an ejection fraction on a dedicated work [employer] officer, trade [employer]. The patient received 10 cc of Gadavist. After 10 minutes inversion recovery sequences were used to assess for infiltration and scar tissue. Flow quantification was performed 2 times during this examination with flow quantification performed at the levels of the ascending aorta above the valve, pulmonary artery above the valve. CONTRAST:  10 cc  of Gadavist FINDINGS: 1. Normal left ventricular size, with LVEDD 40 mm, and LVEDVi 59 mL/m2. Normal left ventricular thickness. Normal left ventricular systolic function (LVEF =63%). There are no regional wall motion abnormalities. Left ventricular parametric mapping notable  for elevated T2 (apical septal 68 ms). ECV elevated in basal inferolateral (46%). There is late gadolinium enhancement in the left ventricular myocardium: Basal inferolateral mid myocardial stripe. Cannot exclude abnormal basal septal resting perfusion. 2. Normal right ventricular size with RVEDVI 69 mL/m2, though mild dilation in RVIDD 41 mm. Normal right ventricular thickness. Mild decrease in right ventricular systolic function (RVEF =45%). Mid free wall akinesis. 3.  Normal left and right atrial size. 4. Normal size of the aortic root, ascending aorta and pulmonary artery. 5. Valve assessment: Aortic Valve: Thickened trileaflet aortic valve. Mean gradient 1 mm Hg. Qualitatively, there is no significant regurgitation. Regurgitant fraction 1%. Pulmonic Valve: Qualitatively, there is no significant regurgitation.  Regurgitant fraction < 1%. Tricuspid Valve: Tri-leaflet valve. Qualitatively, there is no significant regurgitation. Mitral Valve:Qualitatively, there is no significant regurgitation. 6.  Normal pericardium.  Small circumferential pericardial effusion. 7. Grossly, bilateral pleural effusions. Recommended dedicated study if concerned for non-cardiac pathology. IMPRESSION: 1. Normal left ventricular systolic function (LVEF =63%). 2. Mild decrease in right ventricular systolic function (RVEF =45%). Mid free wall akinesis with normal index RV volume. This and LV LGE signal are two minor imaging criteria for AVC syndromes (2024 guidelines update). Alternatively, parametric mapping and LGE signal could be seen in myocarditis, this would be less consistent with the isolated RV findings and the nominal increase in troponin testing. Clinical correlation advised. Stanly Leavens MD Electronically Signed   By: Stanly Leavens M.D.   On: 06/07/2024 23:06   ECHOCARDIOGRAM LIMITED Result Date: 06/05/2024    ECHOCARDIOGRAM LIMITED REPORT   Patient Name:   Katherine Bennett Date of Exam: 06/05/2024 Medical Rec #:  [id number]         Height:       63.0 in Accession #:    [id number]        Weight:       190.0 lb Date of Birth:  09-May-1941         BSA:          1.892 m Patient Age:    83 years          BP:           121/64 mmHg Patient Gender: F                 HR:           88 bpm. Exam Location:  Inpatient Procedure: Limited Echo and Limited Color Doppler (Both Spectral and Color Flow            Doppler were utilized during procedure). Indications:    Pericardial effusion I31.3  History:        Patient has prior history of Echocardiogram examinations, most                 recent 06/04/2024. Risk Factors:Hypertension.  Sonographer:    Thea Norlander RCS Referring Phys: [phone] PETER M S[place] IMPRESSIONS  1. Left ventricular ejection fraction, by estimation, is 55 to 60%. The left ventricle has normal function. The left  ventricle has no regional wall motion abnormalities. There is the interventricular septum is flattened in systole and diastole, consistent with right ventricular pressure and volume overload.  2. Akinesis of the right ventricular free wall with intact motion of the annulus and apex. Right ventricular systolic function is moderately reduced. The right ventricular size is moderately enlarged.  3. A small pericardial effusion is present. The pericardial effusion is surrounding the apex, localized near the right  ventricle and posterior to the left ventricle. There is no evidence of cardiac tamponade. Moderate pleural effusion.  4. The aortic valve is tricuspid. Aortic valve sclerosis is present, with no evidence of aortic valve stenosis.  5. The inferior vena cava is normal in size with greater than 50% respiratory variability, suggesting right atrial pressure of 3 mmHg. Comparison(s): A prior study was performed on 06/04/2024. There was a large pericardial effusion with tamponade present. The ejection fraction was 60-65%. Right ventricular function was normal. FINDINGS  Left Ventricle: Left ventricular ejection fraction, by estimation, is 55 to 60%. The left ventricle has normal function. The left ventricle has no regional wall motion abnormalities. The interventricular septum is flattened in systole and diastole, consistent with right ventricular pressure and volume overload. Right Ventricle: Akinesis of the right ventricular free wall with intact motion of the annulus and apex. The right ventricular size is moderately enlarged. Right ventricular systolic function is moderately reduced. Pericardium: No obvious signs of tamponade present. A small pericardial effusion is present. The pericardial effusion is surrounding the apex, localized near the right ventricle and posterior to the left ventricle. The pericardial effusion appears to contain fibrous material. There is no evidence of cardiac tamponade. Tricuspid Valve:  Tricuspid valve regurgitation is mild. Aortic Valve: The aortic valve is tricuspid. Aortic valve sclerosis is present, with no evidence of aortic valve stenosis. Venous: The inferior vena cava is normal in size with greater than 50% respiratory variability, suggesting right atrial pressure of 3 mmHg. Additional Comments: There is a moderate pleural effusion.  LEFT VENTRICLE PLAX 2D LVIDd:         4.10 cm LVIDs:         2.70 cm LV PW:         1.10 cm LV IVS:        0.90 cm  RIGHT VENTRICLE            IVC RV S prime:     8.21 cm/s  IVC diam: 1.40 cm TAPSE (M-mode): 1.2 cm LEFT ATRIUM         Index LA diam:    2.70 cm 1.43 cm/m Emeline Calender Electronically signed by Emeline Calender Signature Date/Time: 06/05/2024/3:41:10 PM    Final    CARDIAC CATHETERIZATION Result Date: 06/04/2024 Successful pericardiocentesis with removal of 770 cc of hemorrhagic fluid Plan: drain left in place. Will get limited Echo in am. Fluid sent for analysis.   ECHOCARDIOGRAM COMPLETE Result Date: 06/04/2024    ECHOCARDIOGRAM REPORT   Patient Name:   Katherine Bennett [facility] Date of Exam: 06/04/2024 Medical Rec #:  [id number]         Height:       63.0 in Accession #:    [id number]        Weight:       190.9 lb Date of Birth:  17-Aug-1941         BSA:          1.896 m Patient Age:    83 years          BP:           120/76 mmHg Patient Gender: F                 HR:           102 bpm. Exam Location:  Inpatient Procedure: 2D Echo (Both Spectral and Color Flow Doppler were utilized during            procedure).  Indications:    Pericardial Effusion  History:        Patient has prior history of Echocardiogram examinations. Risk                 Factors:Hypertension.  Sonographer:    Charmaine Gaskins Referring Phys: [id number] ELSIE JAYSON [place] IMPRESSIONS  1. Left ventricular ejection fraction, by estimation, is 60 to 65%. The left ventricle has normal function. The left ventricle has no regional wall motion abnormalities. Left ventricular diastolic  parameters are consistent with Grade I diastolic dysfunction (impaired relaxation).  2. Right ventricular systolic function is normal. The right ventricular size is normal.  3. Large pericardial effusion with tamponade. IVC 2.6 mitral and LVOT respiratory variation RV/RA diastolic collapse largest dimension 2.6 cm and circumferential. Large pericardial effusion. The pericardial effusion is circumferential.  4. The mitral valve is abnormal. Trivial mitral valve regurgitation. No evidence of mitral stenosis.  5. The aortic valve is tricuspid. There is mild calcification of the aortic valve. Aortic valve regurgitation is not visualized. Aortic valve sclerosis is present, with no evidence of aortic valve stenosis.  6. The inferior vena cava is normal in size with greater than 50% respiratory variability, suggesting right atrial pressure of 3 mmHg. FINDINGS  Left Ventricle: Left ventricular ejection fraction, by estimation, is 60 to 65%. The left ventricle has normal function. The left ventricle has no regional wall motion abnormalities. Strain was performed and the global longitudinal strain is indeterminate. The left ventricular internal cavity size was normal in size. There is no left ventricular hypertrophy. Left ventricular diastolic parameters are consistent with Grade I diastolic dysfunction (impaired relaxation). Right Ventricle: The right ventricular size is normal. No increase in right ventricular wall thickness. Right ventricular systolic function is normal. Left Atrium: Left atrial size was normal in size. Right Atrium: Right atrial size was normal in size. Pericardium: Large pericardial effusion with tamponade. IVC 2.6 mitral and LVOT respiratory variation RV/RA diastolic collapse largest dimension 2.6 cm and circumferential. A large pericardial effusion is present. The pericardial effusion is circumferential. There is diastolic collapse of the right ventricular free wall, diastolic collapse of the right  atrial wall, excessive respiratory variation in the mitral valve spectral Doppler velocities and excessive respiratory variation in the LVOT spectral Doppler velocities. Mitral Valve: The mitral valve is abnormal. There is mild thickening of the mitral valve leaflet(s). Trivial mitral valve regurgitation. No evidence of mitral valve stenosis. Tricuspid Valve: The tricuspid valve is normal in structure. Tricuspid valve regurgitation is not demonstrated. No evidence of tricuspid stenosis. Aortic Valve: The aortic valve is tricuspid. There is mild calcification of the aortic valve. Aortic valve regurgitation is not visualized. Aortic valve sclerosis is present, with no evidence of aortic valve stenosis. Pulmonic Valve: The pulmonic valve was normal in structure. Pulmonic valve regurgitation is trivial. No evidence of pulmonic stenosis. Aorta: The aortic root is normal in size and structure. Venous: The inferior vena cava is normal in size with greater than 50% respiratory variability, suggesting right atrial pressure of 3 mmHg. IAS/Shunts: No atrial level shunt detected by color flow Doppler. Additional Comments: 3D was performed not requiring image post processing on an independent workstation and was indeterminate.  LEFT VENTRICLE PLAX 2D LVOT diam:     2.00 cm   Diastology LVOT Area:     3.14 cm  LV e' medial:    8.16 cm/s  LV E/e' medial:  7.1                          LV e' lateral:   9.36 cm/s                          LV E/e' lateral: 6.2  RIGHT VENTRICLE RV Basal diam:  2.30 cm RV Mid diam:    2.30 cm LEFT ATRIUM             Index        RIGHT ATRIUM          Index LA Vol (A2C):   43.2 ml 22.79 ml/m  RA Area:     8.22 cm LA Vol (A4C):   41.8 ml 22.05 ml/m  RA Volume:   14.00 ml 7.38 ml/m LA Biplane Vol: 42.1 ml 22.21 ml/m   AORTA Ao Root diam: 2.80 cm Ao Asc diam:  3.40 cm MITRAL VALVE MV Area (PHT): 8.43 cm    SHUNTS MV Decel Time: 90 msec     Systemic Diam: 2.00 cm MV E velocity:  57.80 cm/s MV A velocity: 97.30 cm/s MV E/A ratio:  0.59 Maude Emmer MD Electronically signed by Maude Emmer MD Signature Date/Time: 06/04/2024/9:32:34 AM    Final    CT Angio Chest PE W and/or Wo Contrast Result Date: 06/02/2024 EXAM: CTA CHEST 06/02/2024 03:26:50 PM TECHNIQUE: CTA of the chest was performed after the administration of 100 mL of iohexol (OMNIPAQUE) 350 MG/ML injection. Multiplanar reformatted images are provided for review. MIP images are provided for review. Automated exposure control, iterative reconstruction, and/or weight based adjustment of the mA/kV was utilized to reduce the radiation dose to as low as reasonably achievable. COMPARISON: Chest radiograph of earlier today. CLINICAL HISTORY: Pulmonary embolism (PE) suspected, high prob. Per triage note: Pt came in complaining of SOB. States she was around daughter who had an upper respiratory illness. Pt states it has been ongoing for about a week. Reports feeling generalized aches, coughing and N/D. FINDINGS: PULMONARY ARTERIES: Pulmonary arteries are adequately opacified for evaluation. The quality of evaluation for pulmonary embolism is good. No evidence of pulmonary embolism. Main pulmonary artery is normal in caliber. MEDIASTINUM: Normal heart size with moderate pericardial effusion. This may be complex, measuring greater than fluid density including on image 94/02. LAD and right coronary artery calcification. Atherosclerosis. There is no acute abnormality of the thoracic aorta. LYMPH NODES: No mediastinal, hilar or axillary lymphadenopathy. LUNGS AND PLEURA: Small left and small to moderate right pleural effusions. No loculation. Bibasilar collapse/consolidation. No pneumothorax. UPPER ABDOMEN: Limited images of the upper abdomen demonstrate cholecystectomy. Otherwise, the visualized portions are unremarkable. 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 No acute bone or soft tissue abnormality.  IMPRESSION: 1. No evidence of pulmonary embolism. 2. Moderate pericardial effusion, possibly complex. Consider further evaluation with echocardiography. 3. Small left and small to moderate right pleural effusions without loculation. Bibasilar collapse/consolidative change is favored to represent compressive atelectasis . cannot exclude a component of concurrent infection or aspiration. Electronically signed by: Rockey Kilts MD 06/02/2024 04:26 PM EDT RP Workstation: [id number]   CT ABDOMEN PELVIS W CONTRAST Result Date: 06/02/2024 CLINICAL DATA:  Acute abdominal pain.  Nausea.  Diarrhea. EXAM: CT ABDOMEN AND PELVIS WITH CONTRAST TECHNIQUE: Multidetector CT imaging of the abdomen and pelvis was performed using the standard protocol following bolus administration of intravenous contrast. RADIATION DOSE  REDUCTION: This exam was performed according to the departmental dose-optimization program which includes automated exposure control, adjustment of the mA and/or kV according to patient size and/or use of iterative reconstruction technique. CONTRAST:  100mL OMNIPAQUE IOHEXOL 350 MG/ML SOLN COMPARISON:  None Available. FINDINGS: Lower Chest: Large pericardial effusion. Small to moderate bilateral pleural effusions and bibasilar atelectasis. Hepatobiliary: A few tiny sub-cm hepatic cysts are noted. No suspicious hepatic masses identified. Prior cholecystectomy. No evidence of biliary obstruction. Pancreas:  No mass or inflammatory changes. Spleen: Within normal limits in size and appearance. Adrenals/Urinary Tract: No suspicious masses identified. No evidence of ureteral calculi or hydronephrosis. Stomach/Bowel: No evidence of obstruction, inflammatory process or abnormal fluid collections. Vascular/Lymphatic: No pathologically enlarged lymph nodes. No acute vascular findings. Reproductive: Prior hysterectomy. Benign-appearing right ovarian cyst measuring 4.3 by 2.7 x 2.2 cm. Benign-appearing left ovarian cyst  measuring 5.8 by 3.6 x 3.1 cm. Tiny amount of free pelvic fluid noted. Other: Tiny fat-containing umbilical hernia. Tiny fat-containing left inguinal hernia. Musculoskeletal:  No suspicious bone lesions identified. IMPRESSION: Large pericardial effusion. Small to moderate bilateral pleural effusions and bibasilar atelectasis. Bilateral benign-appearing ovarian cysts, measuring 5.8 cm on the left, and 4.3 cm on the right. Recommend follow-up US  in 6-12 months. Reference: JACR 2020 Feb; 17(2):248-254 Tiny amount of free pelvic fluid. Tiny fat-containing umbilical and left inguinal hernias. Electronically Signed   By: Norleen DELENA Kil M.D.   On: 06/02/2024 16:24   DG Chest Portable 1 View Result Date: 06/02/2024 CLINICAL DATA:  sob EXAM: PORTABLE CHEST - 1 VIEW COMPARISON:  December 14, 2016 FINDINGS: Bilateral perihilar interstitial opacities. Hazy airspace opacities in both lung bases with blunting of the costophrenic sulci. No pneumothorax. Moderate cardiomegaly. Tortuous aorta with aortic atherosclerosis. No acute fracture or destructive lesions. Multilevel thoracic osteophytosis. IMPRESSION: 1. Bilateral perihilar interstitial opacities, which may represent atypical/viral infection or interstitial, in the correct clinical context. 2. Hazy airspace opacities in both lung bases with blunting of the costophrenic sulci, likely small layering pleural effusions with bibasilar atelectasis. Alternatively, superimposed bronchopneumonia or aspiration could have this appearance. Electronically Signed   By: Rogelia Myers M.D.   On: 06/02/2024 14:29    Microbiology: Results for orders placed or performed during the hospital encounter of 06/02/24  Resp panel by RT-PCR (RSV, Flu A&B, Covid) Anterior Nasal Swab     Status: None   Collection Time: 06/02/24  1:05 PM   Specimen: Anterior Nasal Swab  Result Value Ref Range Status   SARS Coronavirus 2 by RT PCR NEGATIVE NEGATIVE Final    Comment: (NOTE) SARS-CoV-2 target  nucleic acids are NOT DETECTED.  The SARS-CoV-2 RNA is generally detectable in upper respiratory specimens during the acute phase of infection. The lowest concentration of SARS-CoV-2 viral copies this assay can detect is 138 copies/mL. A negative result does not preclude SARS-Cov-2 infection and should not be used as the sole basis for treatment or other patient management decisions. A negative result may occur with  improper specimen collection/handling, submission of specimen other than nasopharyngeal swab, presence of viral mutation(s) within the areas targeted by this assay, and inadequate number of viral copies(<138 copies/mL). A negative result must be combined with clinical observations, patient history, and epidemiological information. The expected result is Negative.  Fact Sheet for Patients:  BloggerCourse.com  Fact Sheet for Healthcare Providers:  SeriousBroker.it  This test is no t yet approved or cleared by the [place]  FDA and  has been authorized for detection and/or diagnosis of SARS-CoV-2 by FDA  under an Emergency Use Authorization (EUA). This EUA will remain  in effect (meaning this test can be used) for the duration of the COVID-19 declaration under Section 564(b)(1) of the Act, 21 U.S.C.section 360bbb-3(b)(1), unless the authorization is terminated  or revoked sooner.       Influenza A by PCR NEGATIVE NEGATIVE Final   Influenza B by PCR NEGATIVE NEGATIVE Final    Comment: (NOTE) The Xpert Xpress SARS-CoV-2/FLU/RSV plus assay is intended as an aid in the diagnosis of influenza from Nasopharyngeal swab specimens and should not be used as a sole basis for treatment. Nasal washings and aspirates are unacceptable for Xpert Xpress SARS-CoV-2/FLU/RSV testing.  Fact Sheet for Patients: BloggerCourse.com  Fact Sheet for Healthcare  Providers: SeriousBroker.it  This test is not yet approved or cleared by the [place]  FDA and has been authorized for detection and/or diagnosis of SARS-CoV-2 by FDA under an Emergency Use Authorization (EUA). This EUA will remain in effect (meaning this test can be used) for the duration of the COVID-19 declaration under Section 564(b)(1) of the Act, 21 U.S.C. section 360bbb-3(b)(1), unless the authorization is terminated or revoked.     Resp Syncytial Virus by PCR NEGATIVE NEGATIVE Final    Comment: (NOTE) Fact Sheet for Patients: BloggerCourse.com  Fact Sheet for Healthcare Providers: SeriousBroker.it  This test is not yet approved or cleared by the [place]  FDA and has been authorized for detection and/or diagnosis of SARS-CoV-2 by FDA under an Emergency Use Authorization (EUA). This EUA will remain in effect (meaning this test can be used) for the duration of the COVID-19 declaration under Section 564(b)(1) of the Act, 21 U.S.C. section 360bbb-3(b)(1), unless the authorization is terminated or revoked.  Performed at [facility], [address] [place], [place] [address]   Respiratory (~20 pathogens) panel by PCR     Status: Abnormal   Collection Time: 06/02/24  1:05 PM   Specimen: Nasopharyngeal Swab; Respiratory  Result Value Ref Range Status   Adenovirus NOT DETECTED NOT DETECTED Final   Coronavirus 229E NOT DETECTED NOT DETECTED Final    Comment: (NOTE) The Coronavirus on the Respiratory Panel, DOES NOT test for the novel  Coronavirus (2019 nCoV)    Coronavirus HKU1 NOT DETECTED NOT DETECTED Final   Coronavirus NL63 DETECTED (A) NOT DETECTED Final   Coronavirus OC43 NOT DETECTED NOT DETECTED Final   Metapneumovirus NOT DETECTED NOT DETECTED Final   Rhinovirus / Enterovirus NOT DETECTED NOT DETECTED Final   Influenza A NOT DETECTED NOT DETECTED Final    Influenza B NOT DETECTED NOT DETECTED Final   Parainfluenza Virus 1 NOT DETECTED NOT DETECTED Final   Parainfluenza Virus 2 NOT DETECTED NOT DETECTED Final   Parainfluenza Virus 3 NOT DETECTED NOT DETECTED Final   Parainfluenza Virus 4 NOT DETECTED NOT DETECTED Final   Respiratory Syncytial Virus NOT DETECTED NOT DETECTED Final   Bordetella pertussis NOT DETECTED NOT DETECTED Final   Bordetella Parapertussis NOT DETECTED NOT DETECTED Final   Chlamydophila pneumoniae NOT DETECTED NOT DETECTED Final   Mycoplasma pneumoniae NOT DETECTED NOT DETECTED Final    Comment: Performed at [facility], [address] [place], [place] [address]  Blood culture (routine x 2)     Status: Abnormal   Collection Time: 06/02/24  1:28 PM   Specimen: BLOOD  Result Value Ref Range Status   Specimen Description   Final    BLOOD LEFT ANTECUBITAL Performed at [facility], [address] [place],  [place] [address]    Special Requests   Final    BOTTLES DRAWN AEROBIC AND ANAEROBIC Blood Culture results may not be optimal due to an inadequate volume of blood received in culture bottles Performed at [facility], [address], [place], [place] [address]    Culture  Setup Time   Final    GRAM POSITIVE COCCI ANAEROBIC BOTTLE ONLY CRITICAL RESULT CALLED TO, READ BACK BY AND VERIFIED WITH: PHARMD Bard Jeans on [phone] @1255  by SM    Culture (A)  Final    STAPHYLOCOCCUS EPIDERMIDIS THE SIGNIFICANCE OF ISOLATING THIS ORGANISM FROM A SINGLE SET OF BLOOD CULTURES WHEN MULTIPLE SETS ARE DRAWN IS UNCERTAIN. PLEASE NOTIFY THE MICROBIOLOGY DEPARTMENT WITHIN ONE WEEK IF SPECIATION AND SENSITIVITIES ARE REQUIRED. Performed at [facility], [address] [place], [place] [address]    Report Status 06/05/2024 FINAL  Final  Blood Culture ID Panel (Reflexed)     Status: Abnormal   Collection Time: 06/02/24  1:28 PM  Result Value Ref Range Status   Enterococcus faecalis  NOT DETECTED NOT DETECTED Final   Enterococcus Faecium NOT DETECTED NOT DETECTED Final   Listeria monocytogenes NOT DETECTED NOT DETECTED Final   Staphylococcus species DETECTED (A) NOT DETECTED Final    Comment: CRITICAL RESULT CALLED TO, READ BACK BY AND VERIFIED WITH: PHARMD Bard Jeans on [phone] @1255  by SM    Staphylococcus aureus (BCID) NOT DETECTED NOT DETECTED Final   Staphylococcus epidermidis DETECTED (A) NOT DETECTED Final    Comment: CRITICAL RESULT CALLED TO, READ BACK BY AND VERIFIED WITH: PHARMD Bard Jeans on [phone] @1255  by SM    Staphylococcus lugdunensis NOT DETECTED NOT DETECTED Final   Streptococcus species NOT DETECTED NOT DETECTED Final   Streptococcus agalactiae NOT DETECTED NOT DETECTED Final   Streptococcus pneumoniae NOT DETECTED NOT DETECTED Final   Streptococcus pyogenes NOT DETECTED NOT DETECTED Final   A.calcoaceticus-baumannii NOT DETECTED NOT DETECTED Final   Bacteroides fragilis NOT DETECTED NOT DETECTED Final   Enterobacterales NOT DETECTED NOT DETECTED Final   Enterobacter cloacae complex NOT DETECTED NOT DETECTED Final   Escherichia coli NOT DETECTED NOT DETECTED Final   Klebsiella aerogenes NOT DETECTED NOT DETECTED Final   Klebsiella oxytoca NOT DETECTED NOT DETECTED Final   Klebsiella pneumoniae NOT DETECTED NOT DETECTED Final   Proteus species NOT DETECTED NOT DETECTED Final   Salmonella species NOT DETECTED NOT DETECTED Final   Serratia marcescens NOT DETECTED NOT DETECTED Final   Haemophilus influenzae NOT DETECTED NOT DETECTED Final   Neisseria meningitidis NOT DETECTED NOT DETECTED Final   Pseudomonas aeruginosa NOT DETECTED NOT DETECTED Final   Stenotrophomonas maltophilia NOT DETECTED NOT DETECTED Final   Candida albicans NOT DETECTED NOT DETECTED Final   Candida auris NOT DETECTED NOT DETECTED Final   Candida glabrata NOT DETECTED NOT DETECTED Final   Candida krusei NOT DETECTED NOT DETECTED Final   Candida parapsilosis NOT  DETECTED NOT DETECTED Final   Candida tropicalis NOT DETECTED NOT DETECTED Final   Cryptococcus neoformans/gattii NOT DETECTED NOT DETECTED Final   Methicillin resistance mecA/C NOT DETECTED NOT DETECTED Final    Comment: Performed at [facility], [address] [place], [place] [address]  Blood culture (routine x 2)     Status: None   Collection Time: 06/02/24  8:51 PM   Specimen: BLOOD RIGHT ARM  Result Value Ref Range Status   Specimen Description   Final    BLOOD RIGHT ARM Performed at [facility], [address] [place], [place] [address]    Special Requests   Final    BOTTLES DRAWN AEROBIC AND ANAEROBIC Blood Culture adequate volume Performed at [facility], [address], [place], [place] [address]    Culture   Final    NO GROWTH 5 DAYS Performed at [facility], [address] [place], [place] [address]    Report Status 06/07/2024 FINAL  Final  Body fluid culture w Gram Stain     Status: None   Collection Time: 06/04/24  2:02 PM   Specimen: PATH Cytology Misc. fluid; Body Fluid  Result Value Ref Range Status   Specimen Description FLUID  Final   Special Requests NONE  Final   Gram Stain   Final    ABUNDANT WBC PRESENT,BOTH PMN AND MONONUCLEAR NO ORGANISMS SEEN    Culture   Final    NO GROWTH 3 DAYS Performed at [facility], [address], [place], [place] [address]    Report Status 06/08/2024 FINAL  Final  MRSA Next Gen by PCR, Nasal     Status: None   Collection Time: 06/06/24 10:34 AM   Specimen: Nasal Mucosa; Nasal Swab  Result Value Ref Range Status   MRSA by PCR Next Gen NOT DETECTED NOT DETECTED Final    Comment: (NOTE) The GeneXpert MRSA Assay (FDA approved for NASAL specimens only), is one component of a comprehensive MRSA colonization surveillance program. It is not intended to diagnose MRSA infection nor to guide or monitor treatment for MRSA infections. Test performance is not FDA approved in  patients less than 55 years old. Performed at [facility], [address] [place], [place] [address]     Labs: CBC: Recent Labs  Lab 06/02/24 1305 06/02/24 1338 06/04/24 0439 06/05/24 0912 06/06/24 0354 06/07/24 0310 06/08/24 0307  WBC 10.8*   < > 7.6 6.0 5.5 5.9 5.6  NEUTROABS 8.3*  --   --   --   --   --   --   HGB 12.4   < > 11.5* 12.1 11.8* 12.9 12.4  HCT 42.0   < > 38.7 38.8 38.0 41.6 39.9  MCV 86.4   < > 87.2 84.5 85.0 84.4 84.7  PLT 303   < > 272 230 219 257 253   < > = values in this interval not displayed.   Basic Metabolic Panel: Recent Labs  Lab 06/03/24 0438 06/04/24 0439 06/05/24 0238 06/05/24 2022 06/06/24 0354 06/07/24 0310 06/08/24 0307  NA 139   < > 138 139 139 139 139  K 4.5   < > 3.6 4.1 3.4* 4.0 4.1  CL 103   < > 104 103 105 103 104  CO2 22   < > 23 26 26 27 26   GLUCOSE 142*   < > 114* 146* 117* 103* 118*  BUN 17   < > 18 15 14 13 18   CREATININE 0.93   < > 0.97 1.00 0.82 0.78 0.93  CALCIUM  9.3   < > 8.5* 8.3* 8.0* 8.7* 8.6*  MG 1.8  --  1.6* 2.0  --   --   --    < > = values in this interval not displayed.   Liver Function Tests: Recent Labs  Lab 06/02/24 1305 06/04/24 1829  AST 18  --   ALT 18  --   ALKPHOS 118  --   BILITOT 1.6*  --   PROT  6.7  --   ALBUMIN 4.1 3.0*   CBG: No results for input(s): GLUCAP in the last 168 hours.  Discharge time spent: 41 minutes.  Signed: Deliliah Room, MD [facility] 06/08/2024

## 2024-06-08 NOTE — Progress Notes (Signed)
SATURATION QUALIFICATIONS: (This note is used to comply with regulatory documentation for home oxygen)  Patient Saturations on Room Air at Rest = 91%  Patient Saturations on Room Air while Ambulating = 85%  Patient Saturations on 2 Liters of oxygen while Ambulating = 96%  Please briefly explain why patient needs home oxygen:

## 2024-06-08 NOTE — TOC CM/SW Note (Deleted)
 Per progress note from [facility] RN, 06/09/2023 11:04 am  SATURATION QUALIFICATIONS: (This note is used to comply with regulatory documentation for home oxygen)    Patient Saturations on Room Air while Ambulating = 84%  Patient Saturations on 2 Liters of oxygen while Ambulating = 96%  Please briefly explain why patient needs home oxygen:

## 2024-06-08 NOTE — Progress Notes (Signed)
 Advanced Heart Failure Rounding Note  Cardiologist: Dr. Delford ([place])  Chief Complaint: Large Pericardial Effusion w/ Tamponade   Patient Profile   83 y/o female w/ no prior cardiac history admitted to CCU for large pericardial effusion w/ tamponade, in setting of acute respiratory viral infection. S/p pericardicentesis 10/19 w/ removal of 770 cc of hemorrhagic fluid. Course c/b Afib w/ RVR.    Subjective:    Doing well. On colchicine. Remains in NSR on po amio. Not on Eliquis currently.   cMRI EF 63% Mild RV dysfunction EF 45% with midwall AK Pericardium normal with small effusion   Feels ok. Walking halls. No CP   Objective:   Weight Range: 79.9 kg Body mass index is 31.2 kg/m.   Vital Signs:   Temp:  [97.6 F (36.4 C)-97.8 F (36.6 C)] 97.8 F (36.6 C) (10/23 0839) Pulse Rate:  [72-99] 79 (10/23 0900) Resp:  [14-22] 14 (10/23 0900) BP: (101-148)/(44-107) 122/62 (10/23 0900) SpO2:  [89 %-97 %] 96 % (10/23 0900) Weight:  [79.9 kg] 79.9 kg (10/23 0500) Last BM Date : 06/06/24  Weight change: Filed Weights   06/06/24 0500 06/07/24 0200 06/08/24 0500  Weight: 86.2 kg 81.3 kg 79.9 kg    Intake/Output:   Intake/Output Summary (Last 24 hours) at 06/08/2024 9062 Last data filed at 06/08/2024 0600 Gross per 24 hour  Intake 399.82 ml  Output 1475 ml  Net -1075.18 ml      Physical Exam   General:  Elderly woman sitting in chair No resp difficulty HEENT: normal Neck: supple. no JVD. Carotids 2+ bilat; no bruits. No lymphadenopathy or thryomegaly appreciated. Cor: PMI nondisplaced. Regular rate & rhythm. No rubs, gallops or murmurs. Lungs: clear Abdomen: soft, nontender, nondistended. No hepatosplenomegaly. No bruits or masses. Good bowel sounds. Extremities: no cyanosis, clubbing, rash, edema Neuro: alert & orientedx3, cranial nerves grossly intact. moves all 4 extremities w/o difficulty. Affect pleasant  Telemetry   NSR 70-80s Personally  reviewed   Labs    CBC Recent Labs    06/07/24 0310 06/08/24 0307  WBC 5.9 5.6  HGB 12.9 12.4  HCT 41.6 39.9  MCV 84.4 84.7  PLT 257 253   Basic Metabolic Panel Recent Labs    89/79/74 2022 06/06/24 0354 06/07/24 0310 06/08/24 0307  NA 139   < > 139 139  K 4.1   < > 4.0 4.1  CL 103   < > 103 104  CO2 26   < > 27 26  GLUCOSE 146*   < > 103* 118*  BUN 15   < > 13 18  CREATININE 1.00   < > 0.78 0.93  CALCIUM  8.3*   < > 8.7* 8.6*  MG 2.0  --   --   --    < > = values in this interval not displayed.   Liver Function Tests No results for input(s): AST, ALT, ALKPHOS, BILITOT, PROT, ALBUMIN in the last 72 hours.  No results for input(s): LIPASE, AMYLASE in the last 72 hours.  Cardiac Enzymes No results for input(s): CKTOTAL, CKMB, CKMBINDEX, TROPONINI in the last 72 hours.   BNP: BNP (last 3 results) No results for input(s): BNP in the last 8760 hours.  ProBNP (last 3 results) Recent Labs    06/02/24 1305  PROBNP 268.0     D-Dimer No results for input(s): DDIMER in the last 72 hours. Hemoglobin A1C No results for input(s): HGBA1C in the last 72 hours. Fasting Lipid Panel No results  for input(s): CHOL, HDL, LDLCALC, TRIG, CHOLHDL, LDLDIRECT in the last 72 hours. Thyroid  Function Tests No results for input(s): TSH, T4TOTAL, T3FREE, THYROIDAB in the last 72 hours.  Invalid input(s): FREET3  Other results:   Imaging    MR CARDIAC MORPHOLOGY W WO CONTRAST Result Date: 06/07/2024 CLINICAL DATA:  Clinical question of right ventricular dysfunction Study assumes BSA of 1.90 ms. EXAM: CARDIAC MRI TECHNIQUE: The patient was scanned on a 1.5 Tesla GE magnet. A dedicated cardiac coil was used. Functional imaging was done using Fiesta sequences. 2,3, and 4 chamber views were done to assess for RWMA's. Modified Simpson's rule using was used to calculate an ejection fraction on a dedicated work Chief Technology Officer. The patient received 10 cc of Gadavist. After 10 minutes inversion recovery sequences were used to assess for infiltration and scar tissue. Flow quantification was performed 2 times during this examination with flow quantification performed at the levels of the ascending aorta above the valve, pulmonary artery above the valve. CONTRAST:  10 cc  of Gadavist FINDINGS: 1. Normal left ventricular size, with LVEDD 40 mm, and LVEDVi 59 mL/m2. Normal left ventricular thickness. Normal left ventricular systolic function (LVEF =63%). There are no regional wall motion abnormalities. Left ventricular parametric mapping notable for elevated T2 (apical septal 68 ms). ECV elevated in basal inferolateral (46%). There is late gadolinium enhancement in the left ventricular myocardium: Basal inferolateral mid myocardial stripe. Cannot exclude abnormal basal septal resting perfusion. 2. Normal right ventricular size with RVEDVI 69 mL/m2, though mild dilation in RVIDD 41 mm. Normal right ventricular thickness. Mild decrease in right ventricular systolic function (RVEF =45%). Mid free wall akinesis. 3.  Normal left and right atrial size. 4. Normal size of the aortic root, ascending aorta and pulmonary artery. 5. Valve assessment: Aortic Valve: Thickened trileaflet aortic valve. Mean gradient 1 mm Hg. Qualitatively, there is no significant regurgitation. Regurgitant fraction 1%. Pulmonic Valve: Qualitatively, there is no significant regurgitation. Regurgitant fraction < 1%. Tricuspid Valve: Tri-leaflet valve. Qualitatively, there is no significant regurgitation. Mitral Valve:Qualitatively, there is no significant regurgitation. 6.  Normal pericardium.  Small circumferential pericardial effusion. 7. Grossly, bilateral pleural effusions. Recommended dedicated study if concerned for non-cardiac pathology. IMPRESSION: 1. Normal left ventricular systolic function (LVEF =63%). 2. Mild decrease in right ventricular systolic function  (RVEF =45%). Mid free wall akinesis with normal index RV volume. This and LV LGE signal are two minor imaging criteria for AVC syndromes (2024 guidelines update). Alternatively, parametric mapping and LGE signal could be seen in myocarditis, this would be less consistent with the isolated RV findings and the nominal increase in troponin testing. Clinical correlation advised. Stanly Leavens MD Electronically Signed   By: Stanly Leavens M.D.   On: 06/07/2024 23:06   MR CARDIAC VELOCITY FLOW MAP Result Date: 06/07/2024 CLINICAL DATA:  Clinical question of right ventricular dysfunction Study assumes BSA of 1.90 ms. EXAM: CARDIAC MRI TECHNIQUE: The patient was scanned on a 1.5 Tesla GE magnet. A dedicated cardiac coil was used. Functional imaging was done using Fiesta sequences. 2,3, and 4 chamber views were done to assess for RWMA's. Modified Simpson's rule using was used to calculate an ejection fraction on a dedicated work Research officer, [employer]. The patient received 10 cc of Gadavist. After 10 minutes inversion recovery sequences were used to assess for infiltration and scar tissue. Flow quantification was performed 2 times during this examination with flow quantification performed at the levels of the ascending aorta above the  valve, pulmonary artery above the valve. CONTRAST:  10 cc  of Gadavist FINDINGS: 1. Normal left ventricular size, with LVEDD 40 mm, and LVEDVi 59 mL/m2. Normal left ventricular thickness. Normal left ventricular systolic function (LVEF =63%). There are no regional wall motion abnormalities. Left ventricular parametric mapping notable for elevated T2 (apical septal 68 ms). ECV elevated in basal inferolateral (46%). There is late gadolinium enhancement in the left ventricular myocardium: Basal inferolateral mid myocardial stripe. Cannot exclude abnormal basal septal resting perfusion. 2. Normal right ventricular size with RVEDVI 69 mL/m2, though mild dilation in RVIDD 41  mm. Normal right ventricular thickness. Mild decrease in right ventricular systolic function (RVEF =45%). Mid free wall akinesis. 3.  Normal left and right atrial size. 4. Normal size of the aortic root, ascending aorta and pulmonary artery. 5. Valve assessment: Aortic Valve: Thickened trileaflet aortic valve. Mean gradient 1 mm Hg. Qualitatively, there is no significant regurgitation. Regurgitant fraction 1%. Pulmonic Valve: Qualitatively, there is no significant regurgitation. Regurgitant fraction < 1%. Tricuspid Valve: Tri-leaflet valve. Qualitatively, there is no significant regurgitation. Mitral Valve:Qualitatively, there is no significant regurgitation. 6.  Normal pericardium.  Small circumferential pericardial effusion. 7. Grossly, bilateral pleural effusions. Recommended dedicated study if concerned for non-cardiac pathology. IMPRESSION: 1. Normal left ventricular systolic function (LVEF =63%). 2. Mild decrease in right ventricular systolic function (RVEF =45%). Mid free wall akinesis with normal index RV volume. This and LV LGE signal are two minor imaging criteria for AVC syndromes (2024 guidelines update). Alternatively, parametric mapping and LGE signal could be seen in myocarditis, this would be less consistent with the isolated RV findings and the nominal increase in troponin testing. Clinical correlation advised. Stanly Leavens MD Electronically Signed   By: Stanly Leavens M.D.   On: 06/07/2024 23:06   MR CARDIAC VELOCITY FLOW MAP Result Date: 06/07/2024 CLINICAL DATA:  Clinical question of right ventricular dysfunction Study assumes BSA of 1.90 ms. EXAM: CARDIAC MRI TECHNIQUE: The patient was scanned on a 1.5 Tesla GE magnet. A dedicated cardiac coil was used. Functional imaging was done using Fiesta sequences. 2,3, and 4 chamber views were done to assess for RWMA's. Modified Simpson's rule using was used to calculate an ejection fraction on a dedicated work Chief Technology Officer. The patient received 10 cc of Gadavist. After 10 minutes inversion recovery sequences were used to assess for infiltration and scar tissue. Flow quantification was performed 2 times during this examination with flow quantification performed at the levels of the ascending aorta above the valve, pulmonary artery above the valve. CONTRAST:  10 cc  of Gadavist FINDINGS: 1. Normal left ventricular size, with LVEDD 40 mm, and LVEDVi 59 mL/m2. Normal left ventricular thickness. Normal left ventricular systolic function (LVEF =63%). There are no regional wall motion abnormalities. Left ventricular parametric mapping notable for elevated T2 (apical septal 68 ms). ECV elevated in basal inferolateral (46%). There is late gadolinium enhancement in the left ventricular myocardium: Basal inferolateral mid myocardial stripe. Cannot exclude abnormal basal septal resting perfusion. 2. Normal right ventricular size with RVEDVI 69 mL/m2, though mild dilation in RVIDD 41 mm. Normal right ventricular thickness. Mild decrease in right ventricular systolic function (RVEF =45%). Mid free wall akinesis. 3.  Normal left and right atrial size. 4. Normal size of the aortic root, ascending aorta and pulmonary artery. 5. Valve assessment: Aortic Valve: Thickened trileaflet aortic valve. Mean gradient 1 mm Hg. Qualitatively, there is no significant regurgitation. Regurgitant fraction 1%. Pulmonic Valve: Qualitatively, there is no  significant regurgitation. Regurgitant fraction < 1%. Tricuspid Valve: Tri-leaflet valve. Qualitatively, there is no significant regurgitation. Mitral Valve:Qualitatively, there is no significant regurgitation. 6.  Normal pericardium.  Small circumferential pericardial effusion. 7. Grossly, bilateral pleural effusions. Recommended dedicated study if concerned for non-cardiac pathology. IMPRESSION: 1. Normal left ventricular systolic function (LVEF =63%). 2. Mild decrease in right ventricular systolic function  (RVEF =45%). Mid free wall akinesis with normal index RV volume. This and LV LGE signal are two minor imaging criteria for AVC syndromes (2024 guidelines update). Alternatively, parametric mapping and LGE signal could be seen in myocarditis, this would be less consistent with the isolated RV findings and the nominal increase in troponin testing. Clinical correlation advised. Stanly Leavens MD Electronically Signed   By: Stanly Leavens M.D.   On: 06/07/2024 23:06      Medications:     Scheduled Medications:  amiodarone  200 mg Oral BID   Chlorhexidine Gluconate Cloth  6 each Topical Daily   cholecalciferol  3,000 Units Oral Daily   cinacalcet  60 mg Oral QHS   colchicine  0.6 mg Oral BID   famotidine  20 mg Oral Daily   polyethylene glycol  17 g Oral Daily   rosuvastatin  20 mg Oral QPM   senna-docusate  1 tablet Oral Daily    Infusions:    PRN Medications: acetaminophen  **OR** acetaminophen , acetaminophen , fluticasone , guaiFENesin-dextromethorphan, hydrALAZINE, ipratropium-albuterol, ondansetron  **OR** ondansetron  (ZOFRAN ) IV, mouth rinse     Assessment/Plan   1. Pericardial tamponade: Patient has had URI symptoms and had a non-COVID-19 coronavirus on respiratory viral cultures.  Suspect acute viral pericarditis as trigger for tamponade.  S/p pericardiocentesis with pericardial drain in place. Repeat Limited echo 10/19 showed small residual effusion, no tamponade. C/w pericardial drain, 18cc out over last day.  Cytology showed no malignant cells.  - drain pulled 10/22  - Continue colchicine 0.6 bid. Continue x 3 months    2. RV dysfunction: Echo was reviewed and showed mild-moderate RV systolic dysfunction with akinesis of the mid RV free wall, mild RV dilation.  CTA chest at admission was negative for PE.  ?Focal myocarditis but troponin negative on 10/17. 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 Fleta etiology of RV dysfunction remains unclear. ? Stress Will follow with repeat echo in 3 months  3. Atrial fibrillation: With RVR, in setting of acute pericarditis with pericardial effusion. Now back in NSR on po amio  - Continue po amio 200 mg bid would treat at this dose for 1 week then 200 daily for one month. Place Zio at 1 month. If no AF can stop amio.   - Would hold off on anticoagulation for the time being as risks/benefit of AC do not seem to be in her favor unless she has recurrent AF 4. Hypokalemia: resolved w/ K supp  - K 4.0   Ok for d/c today with close f/u in Clinic. Would repeat echo in 1 month to re-evaluate for effusion and RV function    Toribio Fuel, MD 06/08/2024

## 2024-06-08 NOTE — Progress Notes (Signed)
 Discharge   Patient expressed verbal understanding of discharge POC.   Patient given time to ask any questions.  Daughter at bedside.   Oxygen tank at bedside. Additional education included in AVS.  Alert oriented in good spirits.  PIV removed.  Pressure dressings intact.   To be discharged to [facility].

## 2024-06-08 NOTE — TOC Transition Note (Signed)
 Transition of Care [facility]) - Discharge Note   Patient Details  Name: Katherine Bennett MRN: [id number] Date of Birth: [birth year]  Transition of Care [facility] CM/SW Contact:  Justina Delcia Czar, RN Phone Number: [phone] 06/08/2024, 2:06 PM   Clinical Narrative:      Spoke to pt's dtr and pt scheduled for dc home today with HH and oxygen. Offered choice for HH, medicare.gov list placed on chart and provided to pt. Dtr agreeable to [facility] for HH. Contacted [facility] Agent for oxygen for home.  Waiting Unit RN to complete qualifying sat note.    Contacted PCP's office and hospital follow up scheduled    Final next level of care: Home w Home Health Services Barriers to Discharge: No Barriers Identified   Patient Goals and CMS Choice Patient states their goals for this hospitalization and ongoing recovery are:: wants to get better CMS Medicare.gov Compare Post Acute Care list provided to:: Patient Represenative (must comment) Choice offered to / list presented to : Adult Children      Discharge Placement                       Discharge Plan and Services Additional resources added to the After Visit Summary for     Discharge Planning Services: CM Consult Post Acute Care Choice: Home Health          DME Arranged: Oxygen DME Agency: [facility] Date DME Agency Contacted: 06/08/24 Time DME Agency Contacted: 1356 Representative spoke with at DME Agency: Agent CHEADLE Arranged: PT HH Agency: [facility] Date [facility] Agency Contacted: 06/08/24 Time HH Agency Contacted: [phone] Representative spoke with at [facility] Agency: [place]  Social Drivers of Health (SDOH) Interventions SDOH Screenings   Food Insecurity: No Food Insecurity (06/08/2024)  Housing: Unknown (06/08/2024)  Transportation Needs: No Transportation Needs (06/08/2024)  Utilities: Not At Risk (06/02/2024)  Financial Resource Strain: Low Risk  (03/11/2022)   Received from [facility] visits prior to 10/17/2022., [facility]  Physical Activity: Patient Declined (03/11/2022)   Received from [facility]  Social Connections: Unknown (06/02/2024)  Stress: Stress Concern Present (03/11/2022)   Received from [facility] visits prior to 10/17/2022., [facility]  Tobacco Use: Medium Risk (06/02/2024)     Readmission Risk Interventions     No data to display

## 2024-06-08 NOTE — TOC CM/SW Note (Signed)
  SATURATION QUALIFICATIONS: (This note is used to comply with regulatory documentation for home oxygen)   Patient Saturations on Room Air at Rest = 91%   Patient Saturations on Room Air while Ambulating = 85%   Patient Saturations on 2 Liters of oxygen while Ambulating = 96%   Please briefly explain why patient needs home oxygen: PNA, Coronavirus NL63

## 2024-06-13 ENCOUNTER — Telehealth (HOSPITAL_COMMUNITY): Payer: Self-pay

## 2024-06-13 NOTE — Telephone Encounter (Signed)
 Called to confirm/remind patient of their appointment at the [facility] on 06/13/24 9:45.   Appointment:   [] Confirmed  [x] Left mess   [] No answer/No voice mail  [] VM Full/unable to leave message  [] Phone not in service  Patient reminded to bring all medications and/or complete list.  Confirmed patient has transportation. Gave directions, instructed to utilize valet parking.

## 2024-06-14 ENCOUNTER — Ambulatory Visit (HOSPITAL_COMMUNITY): Payer: Self-pay | Admitting: Physician Assistant

## 2024-06-14 ENCOUNTER — Telehealth (HOSPITAL_COMMUNITY): Payer: Self-pay | Admitting: Physician Assistant

## 2024-06-14 ENCOUNTER — Inpatient Hospital Stay (HOSPITAL_BASED_OUTPATIENT_CLINIC_OR_DEPARTMENT_OTHER)
Admit: 2024-06-14 | Discharge: 2024-06-14 | Disposition: A | Discharge status: Home / Self Care | Attending: Physician Assistant | Admitting: Physician Assistant

## 2024-06-14 VITALS — BP 116/62 | HR 97 | Wt 172.4 lb

## 2024-06-14 DIAGNOSIS — Q208 Other congenital malformations of cardiac chambers and connections: Secondary | ICD-10-CM | POA: Diagnosis not present

## 2024-06-14 DIAGNOSIS — I480 Paroxysmal atrial fibrillation: Secondary | ICD-10-CM | POA: Diagnosis not present

## 2024-06-14 DIAGNOSIS — I314 Cardiac tamponade: Secondary | ICD-10-CM

## 2024-06-14 DIAGNOSIS — I10 Essential (primary) hypertension: Secondary | ICD-10-CM | POA: Diagnosis not present

## 2024-06-14 DIAGNOSIS — R197 Diarrhea, unspecified: Secondary | ICD-10-CM

## 2024-06-14 LAB — CBC
HCT: 44.8 % (ref 36.0–46.0)
Hemoglobin: 14.5 g/dL (ref 12.0–15.0)
MCH: 26.6 pg (ref 26.0–34.0)
MCHC: 32.4 g/dL (ref 30.0–36.0)
MCV: 82.2 fL (ref 80.0–100.0)
Platelets: 273 K/uL (ref 150–400)
RBC: 5.45 MIL/uL — ABNORMAL HIGH (ref 3.87–5.11)
RDW: 12.6 % (ref 11.5–15.5)
WBC: 6.5 K/uL (ref 4.0–10.5)
nRBC: 0 % (ref 0.0–0.2)

## 2024-06-14 LAB — COMPREHENSIVE METABOLIC PANEL WITH GFR
ALT: 74 U/L — ABNORMAL HIGH (ref 0–44)
AST: 52 U/L — ABNORMAL HIGH (ref 15–41)
Albumin: 3.7 g/dL (ref 3.5–5.0)
Alkaline Phosphatase: 105 U/L (ref 38–126)
Anion gap: 17 — ABNORMAL HIGH (ref 5–15)
BUN: 13 mg/dL (ref 8–23)
CO2: 22 mmol/L (ref 22–32)
Calcium: 8.8 mg/dL — ABNORMAL LOW (ref 8.9–10.3)
Chloride: 100 mmol/L (ref 98–111)
Creatinine, Ser: 1.18 mg/dL — ABNORMAL HIGH (ref 0.44–1.00)
GFR, Estimated: 45 mL/min — ABNORMAL LOW (ref >60)
Glucose, Bld: 161 mg/dL — ABNORMAL HIGH (ref 70–99)
Potassium: 4.1 mmol/L (ref 3.5–5.1)
Sodium: 139 mmol/L (ref 135–145)
Total Bilirubin: 1.3 mg/dL — ABNORMAL HIGH (ref 0.0–1.2)
Total Protein: 6.8 g/dL (ref 6.5–8.1)

## 2024-06-14 MED ORDER — AMIODARONE HCL 200 MG PO TABS: 200.0000 mg | ORAL_TABLET | Freq: Every day | ORAL | 3 refills | Status: DC

## 2024-06-14 NOTE — Progress Notes (Addendum)
 Advanced Heart Failure Note    PCP: Andrew Truman GRADE., MD Cardiologist: Dr. Delford  Chief Complaint: pericardial tamponade, RV dysfunction, atrial fibrillation  HPI:  Katherine Bennett is an 83 y.o. female with history of HTN, HLD, Erb's plasy, obesity, DM II.   Presented 06/02/24 with N, V, cough and shortness of breath and admitted for acute respiratory failure. Tested positive for coronavirus NL63. On CT and echo large pericardial effusion noted w/ evidence of tamponade physiology. She underwent pericardiocentesis on 10/19 with removal of hemorrhagic fluid. Viral pericarditis suspected. She was started on colchicine. Echo post pericardiocentesis with RV dysfunction raising suspicion for myopericarditis. cMRI w/ LVEF 63%, RVEF 45% with midwall AK. cMRI reviewed with imaging colleagues and etiology of RV dysfunction uncertain. Course c/b Afib with RVR. Converted to SR with IV amiodarone. Not placed on anticoagulation d/t hemorrhagic effusion.   She is here today for post hospital follow-up regarding pericardial tamponade, RV dysfunction and atrial fibrillation. She is accompanied by her daughter who assists with the history. Still feels fatigued and weak after recent illness but no longer having shortness of breath. Denies chest pain. Slowly regaining strength. Planning to start PT soon. No orthopnea, PND or lower extremity edema. Only complaint is nausea and episodes of frequent diarrhea since discharge. She is wondering if any of her meds are contributing. Trying to increase po intake. Notes chronic dizziness when lying down if she turns her head a certain way, endorses history of positional vertigo.   Past Medical History:  Diagnosis Date   Erb's palsy    History of squamous cell carcinoma    followed by dermatology   Hyperlipidemia    Hypertension    IBS (irritable bowel syndrome)    Obesity    Osteoporosis 2012    Current Outpatient Medications  Medication Sig Dispense  Refill   acetaminophen  (TYLENOL ) 500 MG tablet Take 1,000 mg by mouth every 6 (six) hours as needed (for pain for headaches).     cetirizine (ZYRTEC) 10 MG tablet Take 10 mg by mouth daily as needed for allergies or rhinitis.     Cholecalciferol (VITAMIN D3) 3000 units TABS Take 3,000 Units by mouth daily.     cinacalcet (SENSIPAR) 60 MG tablet Take 60 mg by mouth at bedtime.     colchicine 0.6 MG tablet Take 1 tablet (0.6 mg total) by mouth 2 (two) times daily. 60 tablet 0   famotidine (PEPCID) 20 MG tablet Take 1 tablet (20 mg total) by mouth daily. 30 tablet 0   fluticasone  (FLONASE ) 50 MCG/ACT nasal spray Place 1 spray into both nostrils daily as needed (seasonal allergies).      guaiFENesin-dextromethorphan (ROBITUSSIN DM) 100-10 MG/5ML syrup Take 5 mLs by mouth every 4 (four) hours as needed for cough. 118 mL 0   MAGNESIUM CITRATE PO Take 1 tablet by mouth daily.     metFORMIN (GLUCOPHAGE-XR) 500 MG 24 hr tablet Take 500 mg by mouth daily with breakfast.     NON FORMULARY Take 0.5 Pieces by mouth See admin instructions. THC-based gummie - Chew 0.5 gummie by mouth at bedtime to aid sleep     Oral Electrolytes (LIQUID I.V.) PACK Take 1 packet by mouth daily.     rosuvastatin (CRESTOR) 20 MG tablet Take 20 mg by mouth at bedtime.     Turmeric Curcumin CAPS Take 1 capsule by mouth daily.     amiodarone (PACERONE) 200 MG tablet Take 1 tablet (200 mg total) by mouth daily. 60 tablet 3  No current facility-administered medications for this encounter.    Allergies  Allergen Reactions   Losartan  Other (See Comments)    Persistent hyperkalemia   Morphine Other (See Comments)    Delusions/Psychosis/Stated it made her feel crazy   Morphine And Codeine Other (See Comments)    Hallucinations    Spironolactone Other (See Comments)    Hyperkalemia      Social History   Socioeconomic History   Marital status: Married    Spouse name: Not on file   Number of children: Not on file    Years of education: Not on file   Highest education level: Not on file  Occupational History   Occupation:  auto auction title clerk  Tobacco Use   Smoking status: Former    Current packs/day: 0.00    Types: Cigarettes    Quit date: 09/07/1982    Years since quitting: 41.7   Smokeless tobacco: Never  Substance and Sexual Activity   Alcohol use: Yes    Alcohol/week: 0.0 standard drinks of alcohol    Comment: occasional wine or mimosa   Drug use: No   Sexual activity: Not on file  Other Topics Concern   Not on file  Social History Narrative   Not on file   Social Drivers of Health   Financial Resource Strain: Low Risk  (03/11/2022)   Received from [facility] visits prior to [facility]17/2022[facility]   Overall Financial Resource Strain (CARDIA)    Difficulty of Paying Living Expenses: Not hard at all  Food Insecurity: No Food Insecurity (06/08/2024)   Hunger Vital Sign    Worried About Running Out of Food in the Last Year: Never true    Ran Out of Food in the Last Year: Never true  Transportation Needs: No Transportation Needs (06/08/2024)   PRAPARE - Administrator, Civil Service (Medical): No    Lack of Transportation (Non-Medical): No  Physical Activity: Patient Declined (03/11/2022)   Received from [facility]   Exercise Vital Sign    On average, how many days per week do you engage in moderate to strenuous exercise (like a brisk walk)?: Patient declined    On average, how many minutes do you engage in exercise at this level?: Patient declined  Stress: Stress Concern Present (03/11/2022)   Received from [facility] visits prior to 10/17/2022[facility]   [employer] of Occupational Health - Occupational Stress Questionnaire    Feeling of Stress : To some extent  Social Connections: Unknown (06/02/2024)   Social Connection and Isolation Panel    Frequency of Communication with Friends and Family:  Not on file    Frequency of Social Gatherings with Friends and Family: More than three times a week    Attends Religious Services: Not on file    Active Member of Clubs or Organizations: Not on file    Attends Banker Meetings: Not on file    Marital Status: Not on file  Intimate Partner Violence: Not At Risk (06/08/2024)   Humiliation, Afraid, Rape, and Kick questionnaire    Fear of Current or Ex-Partner: No    Emotionally Abused: No    Physically Abused: No    Sexually Abused: No      Family History  Problem Relation Age of Onset   Heart disease Mother    Diabetes Mother    Hypertension Mother    Stroke Mother  carotid stenosis    Vitals:   06/14/24 0933  BP: (!) 144/98  Pulse: 97  SpO2: 94%  Weight: 78.2 kg (172 lb 6.4 oz)     PHYSICAL EXAM: General:  Well appearing elderly female Cor: No JVD. Regular rate & rhythm. No murmurs. Lungs: clear Abdomen: soft, nondistended.  Extremities: no edema Neuro: alert & oriented x 3 Affect pleasant.   ASSESSMENT & PLAN:  1. Pericardial tamponade: Patient has had URI symptoms and had a non-COVID-19 coronavirus on respiratory viral cultures.  Suspect acute viral pericarditis as trigger for tamponade.  S/p pericardiocentesis. Repeat Limited echo 10/19 showed small residual effusion, no tamponade. Cytology showed no malignant cells.  - Continue colchicine 0.6 bid. Continue x 3 months (hopefully can tolerate, see below).   2. RV dysfunction: Echo was reviewed and showed mild-moderate RV systolic dysfunction with akinesis of the mid RV free wall, mild RV dilation.  CTA chest at admission was negative for PE.  ?Focal myocarditis but troponin negative on 10/17. Repeat Trop 10/20 low level at 42   - cMRI EF 63% Mild RV dysfunction EF 45% with midwall AK Pericardium normal with small effusion d/w Dr. Fleta etiology of RV dysfunction remains unclear. ? Stress  - Repeat echo in a few weeks  3. Atrial  fibrillation: With RVR, in setting of acute pericarditis with pericardial effusion.  - ECG today with NSR - Decrease amiodarone to 200 mg daily, continue until obtain zio/receive results - Place Zio at follow-up in a few weeks - Would hold off on anticoagulation for the time being as risks/benefit of AC do not seem to be in her favor unless she has recurrent AF  4. Diarrhea Nausea - May be related to medication. Cutting back amiodarone to once a day. Asked her to stop taking magnesium citrate, has been using it as a supplement (last took a couple of days ago). - Continue colchicine for now - Has been on metformin for months and tolerated well. Lower suspicion this is etiology. - Check labs today  5. HTN - Not currently on meds - BP initially elevated today. Improved on recheck. - Consistently 120s/60s at home - Continue to monitor    Follow-up: PRN, referred for follow-up with Nishan (seen by provider in the hospital) in 3 weeks  [facility], Loyd Salvador N, PA-C 06/14/24

## 2024-06-14 NOTE — Patient Instructions (Addendum)
 CHANGE Amiodarone to 200 mg daily.  Labs done today, your results will be available in MyChart, we will contact you for abnormal readings.  Thank you for allowing us  to provider your heart failure care after your recent hospitalization. Please follow-up with General Cardiology once scheduled.  We have sent them a message to get you scheduled. If you don't her form them within the week please call them to get scheduled at [phone]   If you have any questions, issues, or concerns before your next appointment please call our office at [phone], opt. 2 and leave a message for the triage nurse.  At the [facility], you and your health needs are our priority. As part of our continuing mission to provide you with exceptional heart care, we have created designated Provider Care Teams. These Care Teams include your primary Cardiologist (physician) and Advanced Practice Providers (APPs- Physician Assistants and Nurse Practitioners) who all work together to provide you with the care you need, when you need it.   You may see any of the following providers on your designated Care Team at your next follow up: Dr Toribio Fuel Dr Ezra Shuck Dr. Morene Brownie Greig Mosses, NP Caffie Shed, [place] [facility], [place] Beckey Coe, NP Jordan Lee, NP Ellouise Class, NP Tinnie Redman, PharmD Jaun Bash, PharmD   Please be sure to bring in all your medications bottles to every appointment.    Thank you for choosing [facility]

## 2024-06-14 NOTE — Telephone Encounter (Signed)
 Pt aware.

## 2024-06-16 NOTE — Telephone Encounter (Signed)
 Daughter aware.

## 2024-06-21 NOTE — Progress Notes (Signed)
 [facility] post TCM follow up  Date of call: 06/21/24  Discharged from: [facility]   Updates/Changes since last encounter: I tried to reach the patient for a follow up call. She did not answer the phone at this time. I left a voicemail with the number to reach me to return the call.   Current Questions/Concerns: unable to address at this time   Is patient candidate for Navigation: I will continue to follow   Electronically signed by: Laymon JINNY Bring, RN 06/21/2024 2:40 PM

## 2024-06-22 NOTE — Progress Notes (Signed)
 Subjective:     Patient ID:  Katherine Bennett is a 83 y.o. female. Patient comes in for Follow-up (From hospital for Covid and flu) .  HPI  Katherine Bennett is a 83 y.o. female. Patient comes in for Follow-up (From hospital for Covid and flu ). Patient states that she is still weak and has no appetite due to not being able to taste. Would like a medication for nausea.   The following portions of thepatient's history were reviewed and updated as appropriate: She  has a past medical history of Arthritis, Glaucoma suspect of both eyes (06/30/2019), Hypertension, and Type 2 diabetes mellitus without complication, without long-term current use of insulin    (CMD) (05/17/2020).   She does not have any pertinent problems on file. She  has a past surgical history that includes Cataract extraction w/  intraocular lens implant (Right, 07/15/2017); Cataract extraction w/  intraocular lens implant (Left, 03/11/2017); and Cataract extraction w/  intraocular lens implant (Right, 07/15/2017). Her family history includes Cataracts in her father, maternal grandfather, maternal grandmother, mother, paternal grandfather, and paternal grandmother. She  reports that she quit smoking about 41 years ago. Her smoking use included cigarettes. She has never used smokeless tobacco. She reports that she does not drink alcohol and does not use drugs. She has a current medication list which includes the following prescription(s): cholecalciferol, cinacalcet, escitalopram, hydralazine, metformin, rosuvastatin, trazodone, and ondansetron . Medications Ordered Prior to Encounter[1] She is allergic to losartan , morphine, and spironolactone.. Problem List[2]  Review of Systems:  Complete Review of Systems negative except as stated in HPI or elsewhere in this document.  Objective:    Pertinent Labs Reviewed  Lab Results  Component Value Date   WBC 6.90 05/01/2024   HGB 13.8 05/01/2024   HCT 41.2 05/01/2024   PLT  260 05/01/2024   CHOL 142 05/01/2024   TRIG 132 05/01/2024   HDL 53 (L) 05/01/2024   ALT 15 05/01/2024   AST 15 05/01/2024   NA 139 05/01/2024   K 4.9 05/01/2024   CL 101 05/01/2024   CREATININE 0.82 05/01/2024   BUN 13 05/01/2024   CO2 29 05/01/2024   TSH 1.210 05/01/2024   HGBA1C 6.8 (H) 05/01/2024   Lab Results  Component Value Date   LDLCALC 67 05/01/2024   LDLCALC 83 09/16/2023   Humphrey Visual Field - OU - Both Eyes Humphrey Visual Field Interpretation  Test Date: 01/03/2024. Threshold was 30-2.   Right Eye  Diagnosis: Glaucoma suspect  Patient Cooperation: good.  Reliability: Reliable.  VFI: 99.  MD: -1.37.  Fixation Losses: 1/12 fixation losses.  Findings: within normal limits.   Left Eye  Diagnosis: Glaucoma suspect  Patient Cooperation: good.  Reliability: Reliable.  VFI: 99.  MD: -0.21.  Fixation Losses: 1/11 fixation losses.  Findings: within normal limits.  OCT, Optic Nerve - OU - Both Eyes Optic Nerve OCT Interpretation  Test Date: 01/03/2024.  Diagnosis: Glaucoma suspect   Right Eye  Patient Cooperation: good.  Technically good study.  Findings: Normal nerve fiber layer thickness.  Progression: There was no change in the nerve fiber layer.   Left Eye  Patient Cooperation: good.  Technically good study.  Findings: Normal nerve fiber layer thickness.  Progression: There was no change in the nerve fiber layer.   Notes Increased cupping OU  Physical Exam  Vitals:   06/22/24 0958  BP: 110/70  Pulse: 92  Temp: 96.9 F (36.1 C)  SpO2: 96%    General  appearance: alert, appears stated age and cooperative Neck: no adenopathy, no carotid bruit, no JVD, supple, symmetrical, trachea midline and thyroid  not enlarged, symmetric, notenderness/mass/nodules Lungs: clear to auscultation bilaterally Heart: regular rate and rhythm, S1, S2 normal, no murmur, click, rub or gallop Extremities: extremities normal, atraumatic, no cyanosis or  edema Pulses: 2+ and symmetric   Assessment:         see below   Plan:      Plan: 1. Pericardial effusion (CMD) (Primary) Pt underwent procedure pericardiocentesis and is feeling fine now. She is on colchicine for the next 3 months  This was thought to be secondary to viral infection  She will continue to f/u with cards at [facility]  - Comprehensive Metabolic Panel; Future - Magnesium; Future  2. Acute respiratory failure with hypoxia    (CMD) Resolved No longer requiring supplemental oxygen  She is debilitated after hospital stay and will need HH PT   3. Nausea Likely due to meds (amidoarone)   - ondansetron  (ZOFRAN -ODT) 4 mg disintegrating tablet; Dissolve 1 tablet (4 mg total) on tongue every 8 (eight) hours as needed for nausea.  Dispense: 30 tablet; Refill: 1  4. Paroxysmal atrial fibrillation    (CMD) She had episode of afib in the hospital that was likely secondary to pericardial tamponade.  Cardiology has her on amiodarone but no anticoagulation. She will continue to follow up with cards.   The [place]  Controlled Substances Reporting System has been reviewed today as part of our clinic protocol. Patient expresses understanding of their current medications and use.  If a new prescription was given today, then I discussed potential side effects, drug interactions, instructions for taking the medication, and the consequences of not taking it.   Patient verbalized an understanding of these instructions. Patient is able to verbalize understanding of the care plan discussed today. Patient's medical and personal goals were discussed today.to current goals:  None The following portions of the patient's history were reviewed and updated as appropriate: allergies, current medications, past family history, past medical history, past social history,past surgical history and problem list. Return if symptoms worsen or fail to improve.   Call sooner if needed.  This document  serves as a record of services personally performed by Dr. Truman Romney.  It was created on their behalf by Lyndell LITTIE Hurst, CMA, a trained medical scribe, and Certified Medical Assistant (CMA). During the course of documenting the history, physical exam and medical decision making, I was functioning as a stage manager. The creation of this record is the provider's dictation and/or activities during the visit.  Electronically signed by Lyndell LITTIE Hurst, CMA 06/22/2024 9:51 AM       I agree the documentation is accurate and complete.  Electronically signed by: Truman CINDERELLA Romney, MD 06/22/2024 10:33 AM   Make me the dino       [1] Current Outpatient Medications on File Prior to Visit  Medication Sig Dispense Refill  . cholecalciferol (VITAMIN D3) 2,000 unit cap capsule Take  by mouth.    . cinacalcet (SENSIPAR) 30 mg tablet Take 30 mg by mouth Once Daily. 30 tablet 5  . escitalopram (LEXAPRO) 10 mg tablet Take 10 mg by mouth Once Daily. 30 tablet 5  . hydrALAZINE (APRESOLINE) 25 mg tablet Take 1 tablet (25 mg total) by mouth 3 (three) times a day. As needed every 8 hours if BP above 160/90 90 tablet 3  . metFORMIN (GLUCOPHAGE-XR) 500 mg 24 hr tablet Take 1 tablet (  500 mg total) by mouth daily with breakfast. 90 tablet 3  . rosuvastatin (CRESTOR) 20 mg tablet TAKE ONE TABLET BY MOUTH ONCE NIGHTLY 90 tablet 3  . traZODone (DESYREL) 100 mg tablet TAKE ONE TABLET BY MOUTH EVERY NIGHT AT BEDTIME FOR SLEEP 90 tablet 1  . [DISCONTINUED] amLODIPine (NORVASC) 5 mg tablet Take 1 tablet (5 mg total) by mouth in the morning and 1 tablet (5 mg total) in the evening. (Patient not taking: Reported on 06/22/2024) 180 tablet 1  . [DISCONTINUED] aspirin  81 mg EC tablet Take 1 tablet by mouth Once Daily. (Patient not taking: Reported on 06/22/2024)     No current facility-administered medications on file prior to visit.  [2] Patient Active Problem List Diagnosis  . Essential hypertension, benign   . Hyperlipidemia  . Astigmatism with presbyopia, bilateral  . Meibomian gland dysfunction (MGD) of both eyes  . Dermatochalasis of both upper eyelids  . Glaucoma suspect of both eyes  . Vitreous syneresis, bilateral  . Hypercalcemia  . Hyperparathyroidism (HCC)  . Type 2 diabetes mellitus with stage 3a chronic kidney disease, without long-term current use of insulin (HCC)  . Senile osteoporosis  . Insomnia  . Stage 3a chronic kidney disease (CKD) (HCC)  . Acute respiratory failure with hypoxia    (CMD)  . Pericardial effusion (CMD)

## 2024-06-28 ENCOUNTER — Ambulatory Visit (HOSPITAL_COMMUNITY)
Admission: RE | Admit: 2024-06-28 | Discharge: 2024-06-28 | Disposition: A | Discharge status: Home / Self Care | Source: Ambulatory Visit | Attending: Internal Medicine | Admitting: Internal Medicine

## 2024-06-28 ENCOUNTER — Ambulatory Visit (HOSPITAL_COMMUNITY): Payer: Self-pay | Admitting: Physician Assistant

## 2024-06-28 DIAGNOSIS — Q208 Other congenital malformations of cardiac chambers and connections: Secondary | ICD-10-CM | POA: Insufficient documentation

## 2024-06-28 LAB — COMPREHENSIVE METABOLIC PANEL WITH GFR
ALT: 78 U/L — ABNORMAL HIGH (ref 0–44)
AST: 48 U/L — ABNORMAL HIGH (ref 15–41)
Albumin: 3.7 g/dL (ref 3.5–5.0)
Alkaline Phosphatase: 111 U/L (ref 38–126)
Anion gap: 12 (ref 5–15)
BUN: 14 mg/dL (ref 8–23)
CO2: 26 mmol/L (ref 22–32)
Calcium: 8.7 mg/dL — ABNORMAL LOW (ref 8.9–10.3)
Chloride: 103 mmol/L (ref 98–111)
Creatinine, Ser: 1.17 mg/dL — ABNORMAL HIGH (ref 0.44–1.00)
GFR, Estimated: 46 mL/min — ABNORMAL LOW (ref >60)
Glucose, Bld: 142 mg/dL — ABNORMAL HIGH (ref 70–99)
Potassium: 4.4 mmol/L (ref 3.5–5.1)
Sodium: 141 mmol/L (ref 135–145)
Total Bilirubin: 2 mg/dL — ABNORMAL HIGH (ref 0.0–1.2)
Total Protein: 6.2 g/dL — ABNORMAL LOW (ref 6.5–8.1)

## 2024-06-28 LAB — MISCELLANEOUS TEST

## 2024-06-29 ENCOUNTER — Telehealth (HOSPITAL_COMMUNITY): Payer: Self-pay

## 2024-06-29 NOTE — Telephone Encounter (Signed)
 Called to confirm/remind patient of their appointment at the [facility] on 06/30/24.   Appointment:   [] Confirmed  [x] Left mess   [] No answer/No voice mail  [] VM Full/unable to leave message  [] Phone not in service  And to bring in all medications and/or complete list.

## 2024-06-30 ENCOUNTER — Ambulatory Visit (HOSPITAL_COMMUNITY): Admission: RE | Admit: 2024-06-30 | Source: Ambulatory Visit

## 2024-06-30 NOTE — Telephone Encounter (Addendum)
 Spoke with daughter Veva. She is aware and gave  her the phone number to Dr. Claiborne office   ----- Message from [facility] [place] N sent at 06/28/2024 11:50 AM EST ----- Liver function tests still mildly elevated. This will need to be followed. WE referred her for follow-up with Dr. Delford who she saw in the hospital. Looks like she doesn't have follow-up scheduled.    Needs to be seen soon to see if she can come off amiodarone. Please see if Cardiology can get her in for appointment ----- Message ----- From: Interface, [facility] Sent: 06/28/2024  11:27 AM EST To: Manuelita Nat Dutch, PA-C

## 2024-07-04 NOTE — Progress Notes (Signed)
 CARDIOLOGY CONSULT NOTE       Patient ID: Katherine Bennett MRN: [id number] DOB/AGE: [birth year] 83 y.o.  Primary Physician: Andrew Truman GRADE., MD Primary Cardiologist: Delford    HPI:  83 y.o. first seen at [facility] in hospital 06/04/24. Admitted with Coronavirus NL63 more bronchitis with CT not showing a lot of parenchymal lung dx. TTE showed large pericardial effusion. She had PAF. She had pericardiocentesis with Dr Jordan 770 cc hemorrhagic fluid. Post tap echo with small effusion. LVEF 55-60% Moderate RV hypokinesis F/U cardiac MRI 06/07/24 Showed LVEF 63% RVEF 45% mid free wall akinesis Small circumferential pericardial effusion and bilateral pleural effusions. CTA 06/02/24 with no evidence of PE.   Converted to NSR in CCU. Rx colchicine . No anticoagulation with self limited PAF and hemorrhagic effusion. Troponons negative suggesting no myocarditis.Cytology no malignancy. Plan was for amiodarone  for a month at 200 mg daily then dc and f/u Zio monitor   Having lots of GI issues likely from amiodarone  and colchicine  No palpitations or signs of PAF. Taking norvasc  ? 5 mg bid for BP ROS All other systems reviewed and negative except as noted above  Past Medical History:  Diagnosis Date   Erb's palsy    History of squamous cell carcinoma    followed by dermatology   Hyperlipidemia    Hypertension    IBS (irritable bowel syndrome)    Obesity    Osteoporosis 2012    Family History  Problem Relation Age of Onset   Heart disease Mother    Diabetes Mother    Hypertension Mother    Stroke Mother        carotid stenosis    Social History   Socioeconomic History   Marital status: Married    Spouse name: Not on file   Number of children: Not on file   Years of education: Not on file   Highest education level: Not on file  Occupational History   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  Tobacco Use   Smoking status: Former    Current packs/day: 0.00    Types:  Cigarettes    Quit date: 09/07/1982    Years since quitting: 41.8   Smokeless tobacco: Never  Substance and Sexual Activity   Alcohol use: Yes    Alcohol/week: 0.0 standard drinks of alcohol    Comment: occasional wine or mimosa   Drug use: No   Sexual activity: Not on file  Other Topics Concern   Not on file  Social History Narrative   Not on file   Social Drivers of Health   Financial Resource Strain: Low Risk  (03/11/2022)   Received from [facility] visits prior to 10/17/2022., [facility]   Overall Financial Resource Strain (CARDIA)    Difficulty of Paying Living Expenses: Not hard at all  Food Insecurity: No Food Insecurity (06/08/2024)   Hunger Vital Sign    Worried About Running Out of Food in the Last Year: Never true    Ran Out of Food in the Last Year: Never true  Transportation Needs: No Transportation Needs (06/08/2024)   PRAPARE - Administrator, Civil Service (Medical): No    Lack of Transportation (Non-Medical): No  Physical Activity: Patient Declined (03/11/2022)   Received from [facility]   Exercise Vital Sign    On average, how many days per week do you engage in moderate to strenuous exercise (like a brisk walk)?: Patient declined    On average, how  many minutes do you engage in exercise at this level?: Patient declined  Stress: Stress Concern Present (03/11/2022)   Received from [facility] visits prior to 10/17/2022., [facility]   [employer] of Occupational Health - Occupational Stress Questionnaire    Feeling of Stress : To some extent  Social Connections: Unknown (06/02/2024)   Social Connection and Isolation Panel    Frequency of Communication with Friends and Family: Not on file    Frequency of Social Gatherings with Friends and Family: More than three times a week    Attends Religious Services: Not on file    Active Member of Clubs or Organizations: Not on file    Attends Tax Inspector Meetings: Not on file    Marital Status: Not on file  Intimate Partner Violence: Not At Risk (06/08/2024)   Humiliation, Afraid, Rape, and Kick questionnaire    Fear of Current or Ex-Partner: No    Emotionally Abused: No    Physically Abused: No    Sexually Abused: No    Past Surgical History:  Procedure Laterality Date   ANKLE FRACTURE SURGERY Right    APPENDECTOMY     CHOLECYSTECTOMY     PARTIAL HYSTERECTOMY     PERICARDIOCENTESIS N/A 06/04/2024   Procedure: PERICARDIOCENTESIS;  Surgeon: Jordan, Salihah Peckham M, MD;  Location: [facility] INVASIVE CV LAB;  Service: Cardiovascular;  Laterality: N/A;   THYROIDECTOMY, PARTIAL     Left thyroid     TONSILLECTOMY        Current Outpatient Medications:    acetaminophen  (TYLENOL ) 500 MG tablet, Take 1,000 mg by mouth every 6 (six) hours as needed (for pain for headaches)., Disp: , Rfl:    amiodarone  (PACERONE ) 200 MG tablet, Take 1 tablet (200 mg total) by mouth daily., Disp: 60 tablet, Rfl: 3   cetirizine (ZYRTEC) 10 MG tablet, Take 10 mg by mouth daily as needed for allergies or rhinitis., Disp: , Rfl:    Cholecalciferol  (VITAMIN D3) 3000 units TABS, Take 3,000 Units by mouth daily., Disp: , Rfl:    cinacalcet  (SENSIPAR ) 60 MG tablet, Take 60 mg by mouth at bedtime., Disp: , Rfl:    colchicine  0.6 MG tablet, Take 1 tablet (0.6 mg total) by mouth 2 (two) times daily., Disp: 60 tablet, Rfl: 0   famotidine  (PEPCID ) 20 MG tablet, Take 1 tablet (20 mg total) by mouth daily., Disp: 30 tablet, Rfl: 0   fluticasone  (FLONASE ) 50 MCG/ACT nasal spray, Place 1 spray into both nostrils daily as needed (seasonal allergies). , Disp: , Rfl:    guaiFENesin -dextromethorphan  (ROBITUSSIN DM) 100-10 MG/5ML syrup, Take 5 mLs by mouth every 4 (four) hours as needed for cough., Disp: 118 mL, Rfl: 0   MAGNESIUM  CITRATE PO, Take 1 tablet by mouth daily., Disp: , Rfl:    metFORMIN (GLUCOPHAGE-XR) 500 MG 24 hr tablet, Take 500 mg by mouth daily with breakfast., Disp: ,  Rfl:    NON FORMULARY, Take 0.5 Pieces by mouth See admin instructions. THC-based gummie - Chew 0.5 gummie by mouth at bedtime to aid sleep, Disp: , Rfl:    Oral Electrolytes (LIQUID I.V.) PACK, Take 1 packet by mouth daily., Disp: , Rfl:    rosuvastatin  (CRESTOR ) 20 MG tablet, Take 20 mg by mouth at bedtime., Disp: , Rfl:    Turmeric Curcumin CAPS, Take 1 capsule by mouth daily., Disp: , Rfl:     Physical Exam: Blood pressure (!) 144/80, pulse 85, height 5' 3 (1.6 m), weight 172 lb (  78 kg), SpO2 97%.   Affect appropriate Healthy:  appears stated age HEENT: normal Neck supple with no adenopathy JVP normal no bruits no thyromegaly Lungs clear with no wheezing and good diaphragmatic motion Heart:  S1/S2 no murmur, no rub, gallop or click PMI normal Abdomen: benighn, BS positve, no tenderness, no AAA no bruit.  No HSM or HJR Distal pulses intact with no bruits No edema Neuro non-focal Skin warm and dry No muscular weakness   Labs:   Lab Results  Component Value Date   WBC 6.5 06/14/2024   HGB 14.5 06/14/2024   HCT 44.8 06/14/2024   MCV 82.2 06/14/2024   PLT 273 06/14/2024    No results for input(s): NA, K, CL, CO2, BUN, CREATININE, CALCIUM , PROT, BILITOT, ALKPHOS, ALT, AST, GLUCOSE in the last 168 hours.  Invalid input(s): LABALBU  Lab Results  Component Value Date   CKTOTAL 63 06/02/2024    Lab Results  Component Value Date   CHOL 150 02/28/2015   Lab Results  Component Value Date   HDL 44 02/28/2015   Lab Results  Component Value Date   LDLCALC 80 02/28/2015   Lab Results  Component Value Date   TRIG 129 02/28/2015   No results found for: CHOLHDL No results found for: LDLDIRECT    Radiology: No results found.   EKG: SR rate 89 no pericardial changes LVH. 09/15/23    ASSESSMENT AND PLAN:   Pericarditis/Tamponade:  post pericardiocentesis hemorrhagic 770 cc. F/u echo small effusion will update. Finished Rx with  colchicine  In setting of no COVID viral infection. Troponin negative MRI with normal LV function and no GAD in LV suggesting no myocarditis. ECG no acute inflammatory changes PAF:  Needs f/u Zio monitor. Off amiodarone  now. No DOAC given hemorrhagic effusion.   RV dysfunction: CTA negative PE. PA pressure estimate not high on TTE Doubt RV dysplasia at her age. Doubt isolated RV myocarditis observe DM:  continue glucophage.  HLD:  on statin  Pulmonary : Non COVID coronavirus infection update CXR had some pleural effusions on MRI LFT;s mild elevated TB 2.0, AST 48, ALT 78 on amiodarone  and statin. D/C amiodarone  repeat labs in a month 07/28/24 HTN;  resume norvasc  but only 5 mg daily f/u primary   Echo limited for effusion CXR for pleural effusions.  Zio monitor for PAF  ESR/CRP LFTS 07/28/24  F/U in 3 months   Signed: Maude Emmer 07/10/2024, 8:13 AM

## 2024-07-10 ENCOUNTER — Ambulatory Visit: Attending: Cardiovascular Disease | Admitting: Cardiovascular Disease

## 2024-07-10 ENCOUNTER — Encounter: Payer: Self-pay | Admitting: Cardiovascular Disease

## 2024-07-10 VITALS — BP 144/80 | HR 85 | Ht 63.0 in | Wt 172.0 lb

## 2024-07-10 DIAGNOSIS — I480 Paroxysmal atrial fibrillation: Secondary | ICD-10-CM

## 2024-07-10 DIAGNOSIS — I314 Cardiac tamponade: Secondary | ICD-10-CM | POA: Diagnosis not present

## 2024-07-10 DIAGNOSIS — Z79899 Other long term (current) drug therapy: Secondary | ICD-10-CM

## 2024-07-10 LAB — HEPATIC FUNCTION PANEL
ALT: 68 IU/L — ABNORMAL HIGH (ref 0–32)
AST: 42 IU/L — ABNORMAL HIGH (ref 0–40)
Albumin: 4.4 g/dL (ref 3.7–4.7)
Alkaline Phosphatase: 136 IU/L — ABNORMAL HIGH (ref 48–129)
Bilirubin Total: 1.7 mg/dL — ABNORMAL HIGH (ref 0.0–1.2)
Bilirubin, Direct: 0.46 mg/dL — ABNORMAL HIGH (ref 0.00–0.40)
Total Protein: 6.6 g/dL (ref 6.0–8.5)

## 2024-07-10 NOTE — Addendum Note (Signed)
 Addended by: Joud Pettinato on: 07/10/2024 11:21 AM   Modules accepted: Orders

## 2024-07-10 NOTE — Patient Instructions (Signed)
 Medication Instructions:  Stop amiodarone   and colchicine   *If you need a refill on your cardiac medications before your next appointment, please call your pharmacy*  Lab Work: ESR, LFT, CRP If you have labs (blood work) drawn today and your tests are completely normal, you will receive your results only by: MyChart Message (if you have MyChart) OR A paper copy in the mail If you have any lab test that is abnormal or we need to change your treatment, we will call you to review the results.  Testing/Procedures: Chest X-ray  EchocardiograM Your physician has requested that you have an echocardiogram. Echocardiography is a painless test that uses sound waves to create images of your heart. It provides your doctor with information about the size and shape of your heart and how well your heart's chambers and valves are working. This procedure takes approximately one hour. There are no restrictions for this procedure. Please do NOT wear cologne, perfume, aftershave, or lotions (deodorant is allowed). Please arrive 15 minutes prior to your appointment time.  Please note: We ask at that you not bring children with you during ultrasound (echo/ vascular) testing. Due to room size and safety concerns, children are not allowed in the ultrasound rooms during exams. Our front office staff cannot provide observation of children in our lobby area while testing is being conducted. An adult accompanying a patient to their appointment will only be allowed in the ultrasound room at the discretion of the ultrasound technician under special circumstances. We apologize for any inconvenience.   Event monitor  [employer] Cardiac Event Monitor Instructions  Your physician has requested you wear your cardiac event monitor for _____ days, (1-30). [employer] may call or text to confirm a shipping address. The monitor will be sent to a land address via UPS. [employer] will not ship a monitor to a PO BOX. It typically  takes 3-5 days to receive your monitor after it has been enrolled. [employer] will assist with USPS tracking if your package is delayed. The telephone number for [employer] is [phone]. Once you have received your monitor, please review the enclosed instructions. Instruction tutorials can also be viewed under help and settings on the enclosed cell phone. Your monitor has already been registered assigning a specific monitor serial # to you.  Billing and Self Pay Discount Information  [employer] has been provided the insurance information we had on file for you.  If your insurance has been updated, please call [employer] at [phone] to provide them with your updated insurance information.   [employer] offers a discounted Self Pay option for patients who have insurance that does not cover their cardiac event monitor or patients without insurance.  The discounted cost of a Self Pay Cardiac Event Monitor would be $225.00 , if the patient contacts [employer] at [phone] within 7 days of applying the monitor to make payment arrangements.  If the patient does not contact [employer] within 7 days of applying the monitor, the cost of the cardiac event monitor will be $350.00.  Applying the monitor  Remove cell phone from case and turn it on. The cell phone works as it consultant and needs to be within [facility] of you at all times. The cell phone will need to be charged on a daily basis. We recommend you plug the cell phone into the enclosed charger at your bedside table every night.  Monitor batteries: You will receive two monitor batteries labelled #1 and #2. These are your recorders. Plug battery #2 onto the  second connection on the enclosed charger. Keep one battery on the charger at all times. This will keep the monitor battery deactivated. It will also keep it fully charged for when you need to switch your monitor batteries. A small light will be blinking on the battery emblem when it  is charging. The light on the battery emblem will remain on when the battery is fully charged.  Open package of a Monitor strip. Insert battery #1 into black hood on strip and gently squeeze monitor battery onto connection as indicated in instruction booklet. Set aside while preparing skin.  Choose location for your strip, vertical or horizontal, as indicated in the instruction booklet. Shave to remove all hair from location. There cannot be any lotions, oils, powders, or colognes on skin where monitor is to be applied. Wipe skin clean with enclosed Saline wipe. Dry skin completely.  Peel paper labeled #1 off the back of the Monitor strip exposing the adhesive. Place the monitor on the chest in the vertical or horizontal position shown in the instruction booklet. One arrow on the monitor strip must be pointing upward. Carefully remove paper labeled #2, attaching remainder of strip to your skin. Try not to create any folds or wrinkles in the strip as you apply it.  Firmly press and release the circle in the center of the monitor battery. You will hear a small beep. This is turning the monitor battery on. The heart emblem on the monitor battery will light up every 5 seconds if the monitor battery in turned on and connected to the patient securely. Do not push and hold the circle down as this turns the monitor battery off. The cell phone will locate the monitor battery. A screen will appear on the cell phone checking the connection of your monitor strip. This may read poor connection initially but change to good connection within the next minute. Once your monitor accepts the connection you will hear a series of 3 beeps followed by a climbing crescendo of beeps. A screen will appear on the cell phone showing the two monitor strip placement options. Touch the picture that demonstrates where you applied the monitor strip.  Your monitor strip and battery are waterproof. You are able to shower,  bathe, or swim with the monitor on. They just ask you do not submerge deeper than 3 feet underwater. We recommend removing the monitor if you are swimming in a lake, river, or ocean.  Your monitor battery will need to be switched to a fully charged monitor battery approximately once a week. The cell phone will alert you of an action which needs to be made.  On the cell phone, tap for details to reveal connection status, monitor battery status, and cell phone battery status. The green dots indicates your monitor is in good status. A red dot indicates there is something that needs your attention.  To record a symptom, click the circle on the monitor battery. In 30-60 seconds a list of symptoms will appear on the cell phone. Select your symptom and tap save. Your monitor will record a sustained or significant arrhythmia regardless of you clicking the button. Some patients do not feel the heart rhythm irregularities. [employer] will notify us  of any serious or critical events.  Refer to instruction booklet for instructions on switching batteries, changing strips, the Do not disturb or Pause features, or any additional questions.  Call [employer] at [phone], to confirm your monitor is transmitting and record your baseline. They will answer any  questions you may have regarding the monitor instructions at that time.  Returning the monitor to [employer]  Place all equipment back into blue box. Peel off strip of paper to expose adhesive and close box securely. There is a prepaid UPS shipping label on this box. Drop in a UPS drop box, or at a UPS facility like [employer]. You may also contact [employer] to arrange UPS to pick up monitor package at your home.  Follow-Up: At [facility], you and your health needs are our priority.  As part of our continuing mission to provide you with exceptional heart care, our providers are all part of one team.  This team includes your primary  Cardiologist (physician) and Advanced Practice Providers or APPs (Physician Assistants and Nurse Practitioners) who all work together to provide you with the care you need, when you need it.  Your next appointment:   3 month(s)  Provider:   Maude Emmer, MD  We recommend signing up for the patient portal called MyChart.  Sign up information is provided on this After Visit Summary.  MyChart is used to connect with patients for Virtual Visits (Telemedicine).  Patients are able to view lab/test results, encounter notes, upcoming appointments, etc.  Non-urgent messages can be sent to your provider as well.   To learn more about what you can do with MyChart, go to [email or web address].   Other Instructions None

## 2024-07-11 ENCOUNTER — Ambulatory Visit: Payer: Self-pay | Admitting: Cardiovascular Disease

## 2024-07-11 DIAGNOSIS — I10 Essential (primary) hypertension: Secondary | ICD-10-CM

## 2024-07-11 DIAGNOSIS — I480 Paroxysmal atrial fibrillation: Secondary | ICD-10-CM

## 2024-07-11 DIAGNOSIS — R748 Abnormal levels of other serum enzymes: Secondary | ICD-10-CM

## 2024-07-11 LAB — C-REACTIVE PROTEIN: CRP: <1 mg/L (ref 0–10)

## 2024-07-11 LAB — SEDIMENTATION RATE: Sed Rate: 11 mm/h (ref 0–40)

## 2024-07-25 ENCOUNTER — Ambulatory Visit: Admitting: Cardiovascular Disease

## 2024-08-15 ENCOUNTER — Ambulatory Visit: Attending: Cardiovascular Disease

## 2024-08-15 DIAGNOSIS — I480 Paroxysmal atrial fibrillation: Secondary | ICD-10-CM | POA: Diagnosis not present

## 2024-08-24 ENCOUNTER — Other Ambulatory Visit (HOSPITAL_COMMUNITY)

## 2024-08-24 ENCOUNTER — Ambulatory Visit (HOSPITAL_COMMUNITY)

## 2024-09-21 ENCOUNTER — Ambulatory Visit (HOSPITAL_COMMUNITY)
Admission: RE | Admit: 2024-09-21 | Discharge: 2024-09-21 | Attending: Cardiovascular Disease | Admitting: Cardiovascular Disease

## 2024-09-21 ENCOUNTER — Ambulatory Visit (HOSPITAL_BASED_OUTPATIENT_CLINIC_OR_DEPARTMENT_OTHER)
Admission: RE | Admit: 2024-09-21 | Discharge: 2024-09-21 | Disposition: A | Source: Ambulatory Visit | Attending: Cardiovascular Disease | Admitting: Cardiovascular Disease

## 2024-09-21 LAB — ECHOCARDIOGRAM LIMITED

## 2024-09-22 LAB — HEPATIC FUNCTION PANEL
ALT: 28 [IU]/L (ref 0–32)
AST: 27 [IU]/L (ref 0–40)
Albumin: 4.7 g/dL (ref 3.7–4.7)
Alkaline Phosphatase: 148 [IU]/L — ABNORMAL HIGH (ref 48–129)
Bilirubin Total: 1.1 mg/dL (ref 0.0–1.2)
Bilirubin, Direct: 0.34 mg/dL (ref 0.00–0.40)
Total Protein: 7 g/dL (ref 6.0–8.5)

## 2024-09-22 LAB — SEDIMENTATION RATE: Sed Rate: 43 mm/h — ABNORMAL HIGH (ref 0–40)

## 2024-09-22 LAB — C-REACTIVE PROTEIN: CRP: 1 mg/L (ref 0–10)

## 2024-10-10 ENCOUNTER — Ambulatory Visit: Admitting: Cardiovascular Disease
